# Patient Record
Sex: Male | Born: 1960 | Race: White | Hispanic: No | Marital: Married | Smoking: Never smoker
Health system: Southern US, Community
[De-identification: ages and names within clinical notes are randomized; demographics above are authoritative.]

## PROBLEM LIST (undated history)

## (undated) DIAGNOSIS — Z01811 Encounter for preprocedural respiratory examination: Secondary | ICD-10-CM

## (undated) DIAGNOSIS — M1612 Unilateral primary osteoarthritis, left hip: Secondary | ICD-10-CM

## (undated) DIAGNOSIS — R10813 Right lower quadrant abdominal tenderness: Secondary | ICD-10-CM

## (undated) DIAGNOSIS — E7800 Pure hypercholesterolemia, unspecified: Secondary | ICD-10-CM

## (undated) DIAGNOSIS — C801 Malignant (primary) neoplasm, unspecified: Secondary | ICD-10-CM

## (undated) HISTORY — PX: BACK SURGERY: SHX140

## (undated) HISTORY — PX: KNEE SURGERY: SHX244

---

## 2017-10-11 ENCOUNTER — Encounter (HOSPITAL_COMMUNITY): Payer: Self-pay | Admitting: Emergency Medicine

## 2017-10-11 ENCOUNTER — Emergency Department (HOSPITAL_COMMUNITY)
Admission: EM | Admit: 2017-10-11 | Discharge: 2017-10-11 | Disposition: A | Discharge status: Home / Self Care | Attending: Emergency Medicine | Admitting: Emergency Medicine

## 2017-10-11 ENCOUNTER — Emergency Department (HOSPITAL_COMMUNITY)

## 2017-10-11 DIAGNOSIS — R0789 Other chest pain: Secondary | ICD-10-CM

## 2017-10-11 DIAGNOSIS — Z85828 Personal history of other malignant neoplasm of skin: Secondary | ICD-10-CM | POA: Insufficient documentation

## 2017-10-11 DIAGNOSIS — Z79899 Other long term (current) drug therapy: Secondary | ICD-10-CM | POA: Insufficient documentation

## 2017-10-11 DIAGNOSIS — R079 Chest pain, unspecified: Secondary | ICD-10-CM | POA: Diagnosis not present

## 2017-10-11 HISTORY — DX: Malignant (primary) neoplasm, unspecified: C80.1

## 2017-10-11 HISTORY — DX: Pure hypercholesterolemia, unspecified: E78.00

## 2017-10-11 LAB — CBC
HCT: 38.1 % — ABNORMAL LOW (ref 39.0–52.0)
Hemoglobin: 13.4 g/dL (ref 13.0–17.0)
MCH: 32.5 pg (ref 26.0–34.0)
MCHC: 35.2 g/dL (ref 30.0–36.0)
MCV: 92.5 fL (ref 78.0–100.0)
Platelets: 186 10*3/uL (ref 150–400)
RBC: 4.12 MIL/uL — ABNORMAL LOW (ref 4.22–5.81)
RDW: 12.5 % (ref 11.5–15.5)
WBC: 6.9 10*3/uL (ref 4.0–10.5)

## 2017-10-11 LAB — BASIC METABOLIC PANEL
Anion gap: 8 (ref 5–15)
BUN: 10 mg/dL (ref 6–20)
CO2: 23 mmol/L (ref 22–32)
Calcium: 8.9 mg/dL (ref 8.9–10.3)
Chloride: 106 mmol/L (ref 101–111)
Creatinine, Ser: 0.84 mg/dL (ref 0.61–1.24)
GFR calc Af Amer: >60 mL/min (ref >60)
GFR calc non Af Amer: >60 mL/min (ref >60)
Glucose, Bld: 94 mg/dL (ref 65–99)
Potassium: 3.4 mmol/L — ABNORMAL LOW (ref 3.5–5.1)
Sodium: 137 mmol/L (ref 135–145)

## 2017-10-11 LAB — I-STAT TROPONIN, ED: Troponin i, poc: 0 ng/mL (ref 0.00–0.08)

## 2017-10-11 LAB — TROPONIN I: Troponin I: <0.03 ng/mL (ref <0.03)

## 2017-10-11 MED ORDER — POTASSIUM CHLORIDE CRYS ER 20 MEQ PO TBCR
40.0000 meq | EXTENDED_RELEASE_TABLET | Freq: Once | ORAL | Status: AC
  Administered 2017-10-11: 40 meq via ORAL
  Filled 2017-10-11: qty 2

## 2017-10-11 NOTE — ED Provider Notes (Addendum)
[facility] Provider Note   CSN: [id number] Arrival date & time: 10/11/17  1919     History   Chief Complaint Chief Complaint  Patient presents with  . Chest Pain    HPI Omar Allen is a 56 y.o. male.  HPI   56 year old male with chest pain.  Symptom onset around 7 PM while eating dinner.  Patient reports bilateral upper extremity numbness that progressed into his neck/face then began having some substernal chest pain.  Associated with dyspnea and he was diaphoretic.  Symptoms slowly resolved over approximately 10-15 minutes.  He was largely asymptomatic by the time EMS arrived.  He was given aspirin.  Reports a past history of esophageal spasm but they feel very different than what he experienced today.  She also [place] cardiac [place].  He reports "moderate blockages" and did not have intervention.  He reports no further symptoms since that time.  He does exercise regularly including walking up approximately 100 flights of steps today when she reports he tolerated well.   Past Medical History:  Diagnosis Date  . Cancer ([place])    skin  . Hypercholesterolemia     There are no active problems to display for this patient.       Home Medications    Prior to Admission medications   Medication Sig Start Date End Date Taking? Authorizing Provider  atorvastatin (LIPITOR) 40 MG tablet Take 40 mg by mouth daily.   Yes [provider]  Calcium 600-200 MG-UNIT tablet Take 1 tablet by mouth daily.   Yes [provider]  Multiple Vitamin (MULTIVITAMIN) capsule Take 1 capsule by mouth daily.   Yes [provider]    Family History No family history on file.  Social History Social History   Tobacco Use  . Smoking status: Never Smoker  . Smokeless tobacco: Never Used  Substance Use Topics  . Alcohol use: Yes    Comment: 3 beers a week  . Drug use: No      Allergies   Patient has no known allergies.   Review of Systems Review of Systems  All systems reviewed and negative, other than as noted in HPI.  Physical Exam Updated Vital Signs Temp 97.8 F (36.6 C) (Oral)   Ht 6' (1.829 m)   Wt 81.6 kg (180 lb)   SpO2 99%   BMI 24.41 kg/m   Physical Exam  Constitutional: He appears well-developed and well-nourished. No distress.  HENT:  Head: Normocephalic and atraumatic.  Eyes: Conjunctivae are normal. Right eye exhibits no discharge. Left eye exhibits no discharge.  Neck: Neck supple.  Cardiovascular: Normal rate, regular rhythm and normal heart sounds. Exam reveals no gallop and no friction rub.  No murmur heard. Pulmonary/Chest: Effort normal and breath sounds normal. No respiratory distress.  Abdominal: Soft. He exhibits no distension. There is no tenderness.  Musculoskeletal: He exhibits no edema or tenderness.  Neurological: He is alert.  Skin: Skin is warm and dry.  Psychiatric: He has a normal mood and affect. His behavior is normal. Thought content normal.  Nursing note and vitals reviewed.    ED Treatments / Results  Labs (all labs ordered are listed, but only abnormal results are displayed) Labs Reviewed  BASIC METABOLIC PANEL - Abnormal; Notable for the following components:      Result Value   Potassium 3.4 (*)    All other components within normal limits  CBC - Abnormal;  Notable for the following components:   RBC 4.12 (*)    HCT 38.1 (*)    All other components within normal limits  TROPONIN I  I-STAT TROPONIN, ED    EKG  EKG Interpretation  Date/Time:  Saturday October 11 2017 19:19:46 EST Ventricular Rate:  67 PR Interval:    QRS Duration: 89 QT Interval:  363 QTC Calculation: 384 R Axis:   86 Text Interpretation:  Sinus rhythm Borderline T abnormalities, inferior leads No old tracing to compare Confirmed by Virgel Manifold [id number]) on 10/11/2017 8:03:02 PM       Radiology Dg Chest 2  View  Result Date: 10/11/2017 CLINICAL DATA:  Left-sided chest pain.  Upper extremity numbness. EXAM: CHEST  2 VIEW COMPARISON:  None. FINDINGS: The heart size and mediastinal contours are within normal limits. Both lungs are clear. The visualized skeletal structures are unremarkable. IMPRESSION: No active cardiopulmonary disease. Electronically Signed   By: Dorise Bullion III M.D   On: 10/11/2017 20:11    Procedures Procedures (including critical care time)  Medications Ordered in ED Medications - No data to display   Initial Impression / Assessment and Plan / ED Course  I have reviewed the triage vital signs and the nursing notes.  Pertinent labs & imaging results that were available during my care of the patient were reviewed by me and considered in my medical decision making (see chart for details).     56 year old male with chest pain.  Some typical features which are concerning.  His EKG is not normal but I do not have any for comparison purposes.  He apparently has CAD to some degree although no previous interventions.     I do not have a great alternative etiology for his symptoms otherwise.  I doubt PE, dissection, infectious, etc. I recommended admission.  He is very hesitant to do so.  He is visiting from [place].  His daughter is competing in swimming at the aquatic center here.  He would prefer to follow back up in [place]. I expressed my concerns to him and his wife about him having a significant cardiac event in the interim.   At the very least, he needs a repeat troponin with his symptoms starting around 1900. He is agreeable to this. I also called cardiology as well to see is they could speak with him. He also recommended admission for rule out, but doesn't think he'll be available in the next couple hours for a non-emergent consult.   Final Clinical Impressions(s) / ED Diagnoses   Final diagnoses:  Chest pain, unspecified type    ED Discharge Orders    None        Virgel Manifold, MD 10/14/17 1128    Virgel Manifold, MD 11/04/17 1550

## 2017-10-11 NOTE — ED Triage Notes (Signed)
Brought by ems for c/o left chest pressure that started while eating dinner.  Had the same thing but less intense 2 years ago that was diagnosed as indigestion.  Also reports having Ccath 2 years ago that showed moderate blockages.  No stents were placed.  Pain free in triage.  Also reports that episode started with numbness in both arms before pain started in chest.

## 2017-10-11 NOTE — ED Notes (Signed)
Taken to xray at this time. 

## 2017-10-11 NOTE — Discharge Instructions (Signed)
Follow-up as soon as you can when you get back home.

## 2017-10-11 NOTE — Consult Note (Addendum)
CARDIOLOGY ED CONSULTATION NOTE  Patient ID: Omar Allen MRN: [id number], DOB/AGE: 54-19-1962   Admit date: 10/11/2017   Primary Physician: System, Pcp Not In Primary Cardiologist: at [place] [facility], [facility]  Reason for Consult:   Chest pain  Requesting Physician: Dr. Wilson Singer (ED)  HPI: This is a 56 y.o. male with moderate CAD and esophageal spasm who presented with an episode of chest pain.  Patient is visiting in [place] when during supper around 7 pm tonight he started having neck pain, radiating to the both arms with numbness and weakness. He then started having elevated BP in 160s with severe chest pressure. It lasted for 10 minutes and he couldn't even stand up. This was associated with diaphoresis. Patient does not remember if these chest pains are similar to his prior episodes 2 years ago. 2 years ago he was having esophageal spasms that usually last for 1-2 minutes and keep going for 2 hours. They improved on their own. He works out every day and takes about 100 steps up. He did not feel any pain during that time. He is adamant about leaving the hospital and not waiting for troponin draws after 10 pm tonight or any stress test.  His initial EKG and trop were negative.    Problem List: Past Medical History:  Diagnosis Date  . Cancer ([place])    skin  . Hypercholesterolemia     Past Surgical History:  Procedure Laterality Date  . BACK SURGERY    . KNEE SURGERY Right      Allergies: No Known Allergies   Home Medications No current facility-administered medications for this encounter.    Current Outpatient Medications  Medication Sig Dispense Refill  . atorvastatin (LIPITOR) 40 MG tablet Take 40 mg by mouth daily.    . Calcium 600-200 MG-UNIT tablet Take 1 tablet by mouth daily.    . Multiple Vitamin (MULTIVITAMIN) capsule Take 1 capsule by mouth daily.       Family History  Problem Relation Age of Onset  . Coronary artery disease Neg Hx      Social  History   Socioeconomic History  . Marital status: Married    Spouse name: Not on file  . Number of children: Not on file  . Years of education: Not on file  . Highest education level: Not on file  Social Needs  . Financial resource strain: Not on file  . Food insecurity - worry: Not on file  . Food insecurity - inability: Not on file  . Transportation needs - medical: Not on file  . Transportation needs - non-medical: Not on file  Occupational History  . Not on file  Tobacco Use  . Smoking status: Never Smoker  . Smokeless tobacco: Never Used  Substance and Sexual Activity  . Alcohol use: Yes    Comment: 3 beers a week  . Drug use: No  . Sexual activity: Not on file  Other Topics Concern  . Not on file  Social History Narrative  . Not on file     Review of Systems: General: fatigue increase weight negative for chills, fever, night sweats  Cardiovascular: leg edema, dyspnea but no chest pain  Dermatological: negative for rash Respiratory: productive cough negative for wheezing  Urologic: negative for hematuria Abdominal: negative for nausea, vomiting, diarrhea, bright red blood per rectum, melena, or hematemesis Neurologic: negative for visual changes, syncope, or dizziness Hematology: no anemia Psychiatry: non suicidal/homicidal  Musculoskeletal: back pain   Physical Exam: Vitals:  BP 116/71   Pulse 70   Temp 97.8 F (36.6 C) (Oral)   Resp 19   Ht 6' (1.829 m)   Wt 81.6 kg (180 lb)   SpO2 98%   BMI 24.41 kg/m  General: not in acute distress Neck: JVP flat, neck supple Heart: regular rate and rhythm, S1, S2, no murmurs lungs: CTAB  GI: non tender, non distended, bowel sounds present Extremities: no edema Neuro: AAO x 3  Psych: normal affect, no anxiety   Labs:   Results for orders placed or performed during the hospital encounter of 10/11/17 (from the past 24 hour(s))  Basic metabolic panel     Status: Abnormal   Collection Time: 10/11/17  7:28 PM    Result Value Ref Range   Sodium 137 135 - 145 mmol/L   Potassium 3.4 (L) 3.5 - 5.1 mmol/L   Chloride 106 101 - 111 mmol/L   CO2 23 22 - 32 mmol/L   Glucose, Bld 94 65 - 99 mg/dL   BUN 10 6 - 20 mg/dL   Creatinine, Ser 0.84 0.61 - 1.24 mg/dL   Calcium 8.9 8.9 - 10.3 mg/dL   GFR calc non Af Amer >60 >60 mL/min   GFR calc Af Amer >60 >60 mL/min   Anion gap 8 5 - 15  CBC     Status: Abnormal   Collection Time: 10/11/17  7:28 PM  Result Value Ref Range   WBC 6.9 4.0 - 10.5 K/uL   RBC 4.12 (L) 4.22 - 5.81 MIL/uL   Hemoglobin 13.4 13.0 - 17.0 g/dL   HCT 38.1 (L) 39.0 - 52.0 %   MCV 92.5 78.0 - 100.0 fL   MCH 32.5 26.0 - 34.0 pg   MCHC 35.2 30.0 - 36.0 g/dL   RDW 12.5 11.5 - 15.5 %   Platelets 186 150 - 400 K/uL  I-stat troponin, ED     Status: None   Collection Time: 10/11/17  8:14 PM  Result Value Ref Range   Troponin i, poc 0.00 0.00 - 0.08 ng/mL   Comment 3             Radiology/Studies: Dg Chest 2 View  Result Date: 10/11/2017 CLINICAL DATA:  Left-sided chest pain.  Upper extremity numbness. EXAM: CHEST  2 VIEW COMPARISON:  None. FINDINGS: The heart size and mediastinal contours are within normal limits. Both lungs are clear. The visualized skeletal structures are unremarkable. IMPRESSION: No active cardiopulmonary disease. Electronically Signed   By: Dorise Bullion III M.D   On: 10/11/2017 20:11    EKG: normal sinus rhythm, no st t wave changes  Echo: not available  Cardiac cath: per patient, had moderate CAD but no further follow up was performed  Medical decision making:  Discussed care with the patient and wife Discussed care with the ED physician  Reviewed labs and imaging personally Reviewed prior records  ASSESSMENT AND PLAN:  This is a 56 y.o. male with known moderate CAD and esophageal spasm presented with isolated episode of atypical chest pain    Active Problems:   Atypical chest pain  Atypical cardiac chest pain, possible ACS Admit for  observation Cycle troponin Serial EKGs Unclear if prior stress test was positive for multivessel disease. Consider cardiac stress test to risk stratify in AM NPO post midnight  Lipid panel, HbA1c, TSH Aspirin and statin  Echocardiogram in AM Patient refused to be admitted.      Signed, Flossie Dibble, MD MS 10/11/2017, 10:10 PM

## 2017-10-11 NOTE — ED Notes (Signed)
Pt ambulated to the bathroom w/o difficulty 

## 2019-04-10 IMAGING — DX DG CHEST 2V
2 series · 2 of 2 positions shown · non-contrast
Comparison: None.

CLINICAL DATA: Left-sided chest pain.  Upper extremity numbness.

EXAM:
CHEST  2 VIEW

[chest pa]
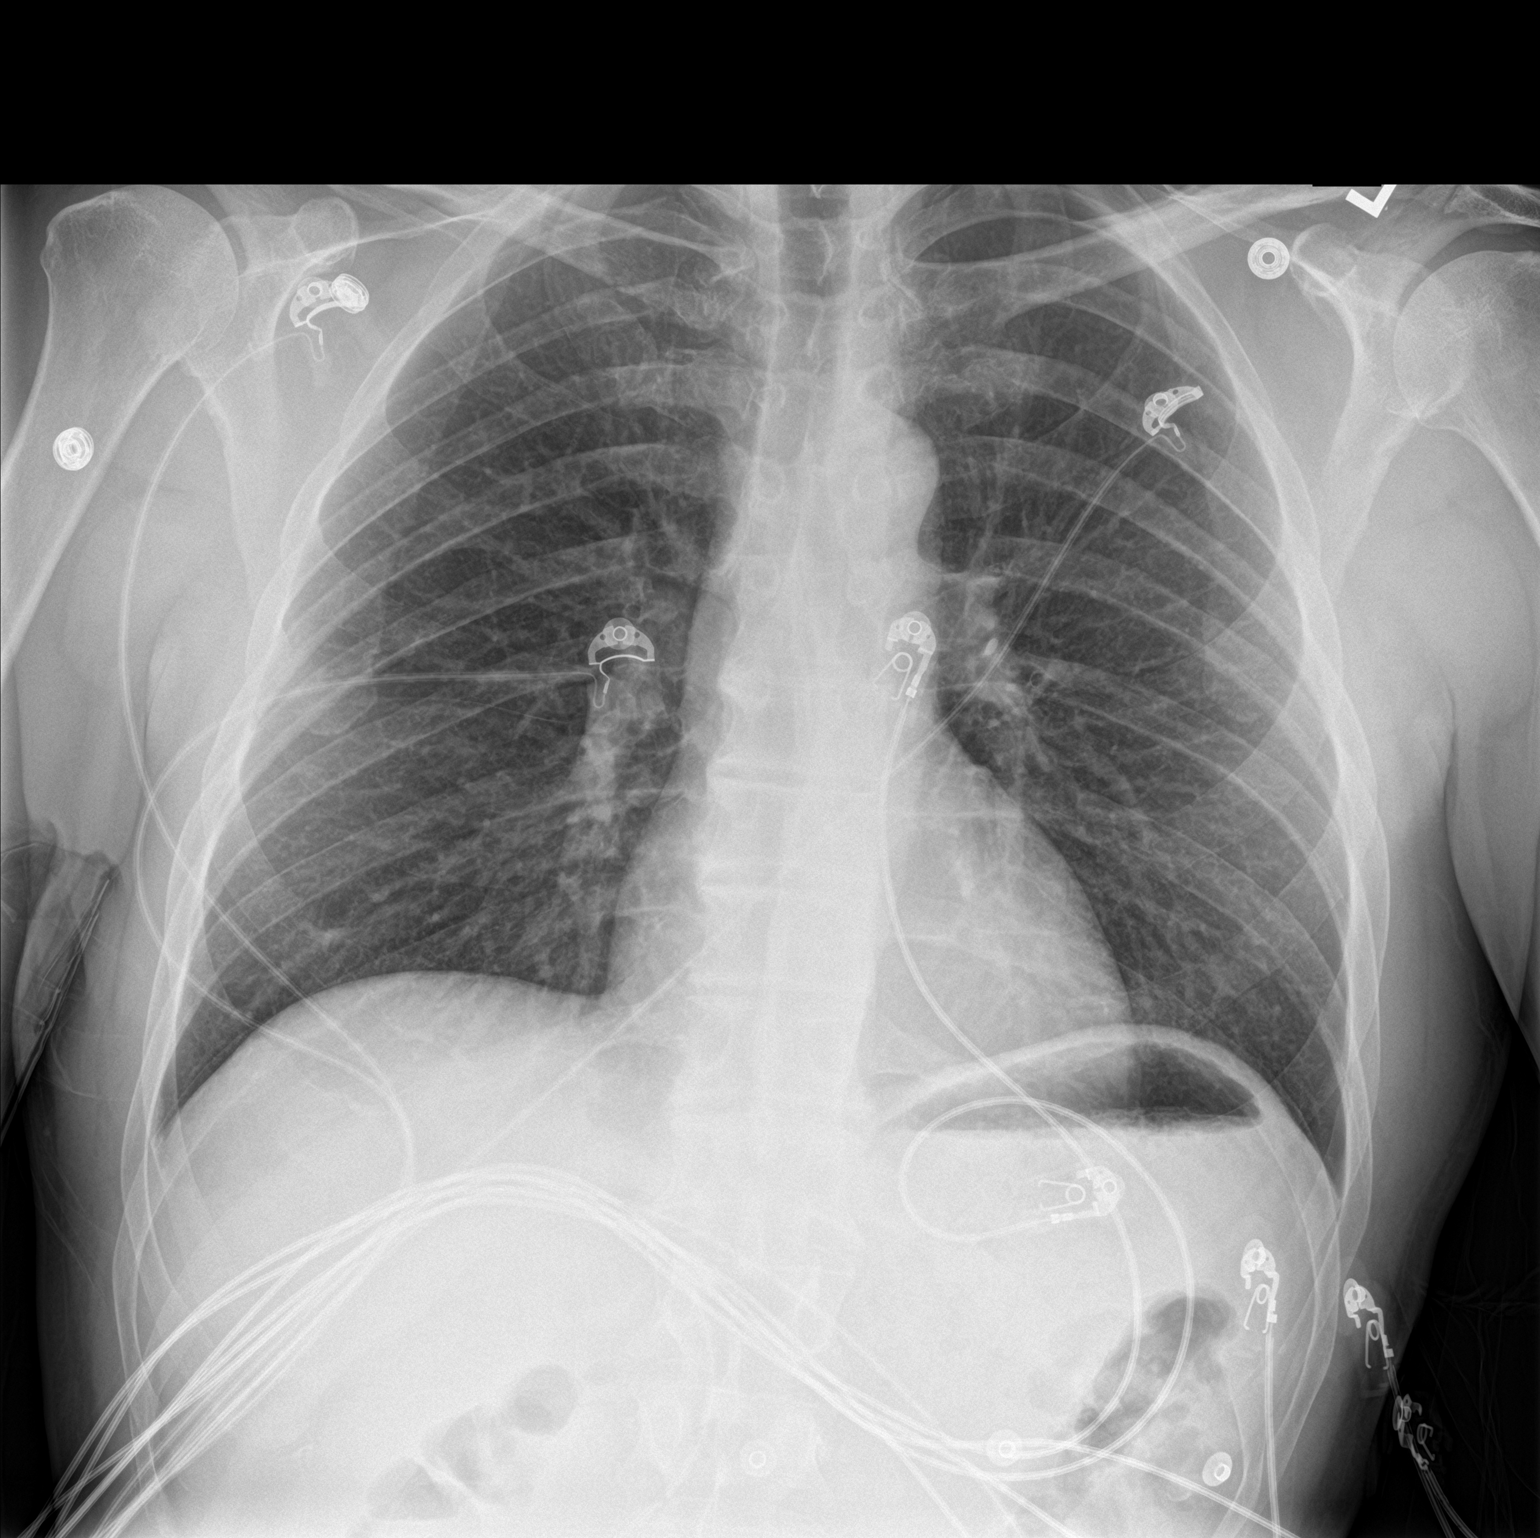

[chest lat]
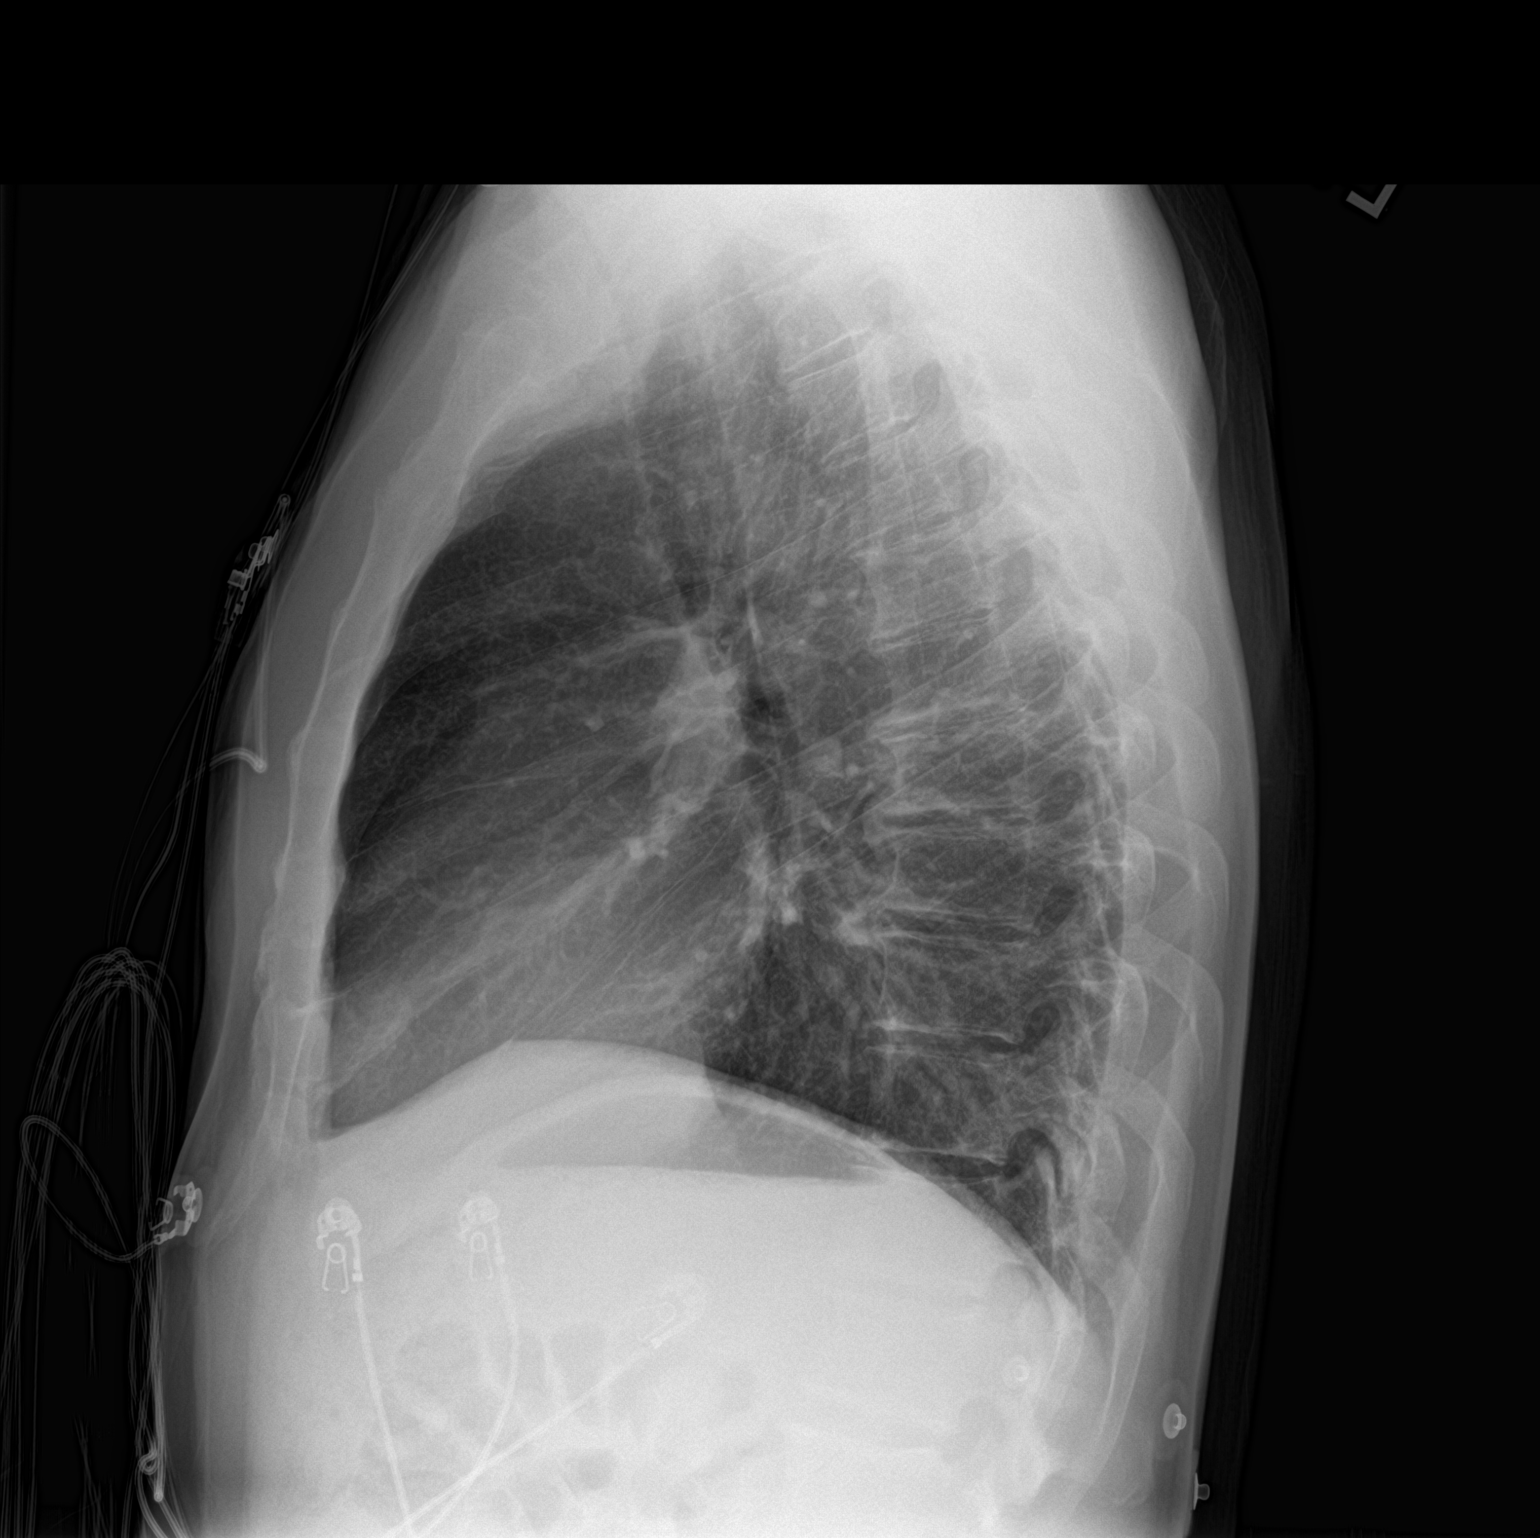

[2 of 2 positions shown; findings below may reference images not displayed]

FINDINGS: The heart size and mediastinal contours are within normal limits.
Both lungs are clear. The visualized skeletal structures are
unremarkable.
IMPRESSION: No active cardiopulmonary disease.

## 2022-02-18 ENCOUNTER — Encounter

## 2022-02-18 ENCOUNTER — Inpatient Hospital Stay: Admit: 2022-02-18 | Payer: PRIVATE HEALTH INSURANCE | Primary: Diagnostic Radiology

## 2022-02-18 ENCOUNTER — Inpatient Hospital Stay: Admit: 2022-02-18 | Payer: PRIVATE HEALTH INSURANCE | Primary: Internal Medicine

## 2022-02-18 DIAGNOSIS — Z01811 Encounter for preprocedural respiratory examination: Secondary | ICD-10-CM

## 2022-02-18 DIAGNOSIS — Z01818 Encounter for other preprocedural examination: Secondary | ICD-10-CM

## 2022-02-18 LAB — EKG 12-LEAD
Atrial Rate: 56 {beats}/min
Calculated P Axis: -10 degrees
Calculated R Axis: 63 degrees
Calculated T Axis: 17 degrees
P-R Interval: 150 ms
Q-T Interval: 420 ms
QRS Duration: 90 ms
QTC Calculation (Bezet): 405 ms
Ventricular Rate: 56 {beats}/min

## 2022-02-18 LAB — CBC WITH AUTO DIFFERENTIAL
Basophils: 0.8 % (ref 0–3)
Eosinophils: 2.5 % (ref 0–5)
Hematocrit: 42.1 % (ref 37.0–50.0)
Hemoglobin: 14.3 gm/dl (ref 12.4–17.2)
Immature Granulocytes: 0.2 % (ref 0.0–3.0)
Lymphocytes: 32.5 % (ref 28–48)
MCH: 32.5 pg (ref 23.0–34.6)
MCHC: 34 gm/dl (ref 30.0–36.0)
MCV: 95.7 fL (ref 80.0–98.0)
MPV: 11.3 fL — ABNORMAL HIGH (ref 6.0–10.0)
Monocytes: 10.1 % (ref 1–13)
Neutrophils Segmented: 53.9 % (ref 34–64)
Nucleated RBCs: 0 (ref 0–0)
Platelets: 195 10*3/uL (ref 140–450)
RBC: 4.4 M/uL (ref 3.80–5.70)
RDW: 43.7 (ref 35.1–43.9)
WBC: 4.9 10*3/uL (ref 4.0–11.0)

## 2022-02-18 LAB — URINALYSIS
Bilirubin, Urine: NEGATIVE
Blood, Urine: NEGATIVE
Glucose, Ur: NEGATIVE mg/dl
Ketones, Urine: NEGATIVE mg/dl
Leukocyte Esterase, Urine: NEGATIVE
Nitrite, Urine: NEGATIVE
Protein, Urine: NEGATIVE mg/dl
Specific Gravity, Urine: 1.01 (ref 1.005–1.030)
Urobilinogen, Urine: 0.2 mg/dl (ref 0.0–1.0)
pH, Urine: 6 (ref 5.0–9.0)

## 2022-02-18 LAB — COMPREHENSIVE METABOLIC PANEL
ALT: 22 U/L (ref 10–49)
AST: 30 U/L (ref 0.0–33.9)
Albumin: 3.9 gm/dl (ref 3.4–5.0)
Alkaline Phosphatase: 44 U/L — ABNORMAL LOW (ref 46–116)
Anion Gap: 9 mmol/L (ref 5–15)
BUN: 11 mg/dl (ref 9–23)
CO2: 26 mEq/L (ref 20–31)
Calcium: 9 mg/dl (ref 8.7–10.4)
Chloride: 108 mEq/L — ABNORMAL HIGH (ref 98–107)
Creatinine: 0.76 mg/dl (ref 0.70–1.30)
GFR African American: >60
GFR Non-African American: >60
Glucose: 97 mg/dl (ref 74–106)
Potassium: 4.1 mEq/L (ref 3.5–5.1)
Sodium: 143 mEq/L (ref 136–145)
Total Bilirubin: 0.9 mg/dl (ref 0.30–1.20)
Total Protein: 6.6 gm/dl (ref 5.7–8.2)

## 2022-02-18 LAB — MRSA BY PCR: MRSA PCR screen: NEGATIVE

## 2022-02-18 LAB — PROTIME-INR
INR: 0.9 (ref 0.1–1.1)
Protime: 10.4 seconds (ref 10.2–12.9)

## 2022-02-18 LAB — APTT: aPTT: 30.8 seconds (ref 25.1–36.5)

## 2022-02-18 LAB — HEMOGLOBIN A1C: Hemoglobin A1C: 5.5 % (ref 3.8–5.6)

## 2022-02-19 LAB — CULTURE, URINE: Culture Result: NO GROWTH

## 2022-03-04 NOTE — Progress Notes (Signed)
Patient attended Pre-Op Joint Replacement Class on 12/24/2021.  Information provided regarding general expectations verbally and in writing (hip booklet) including (but not limited to):   Discharge planning, physical therapy and exercises to start prior to surgery, occupational therapy, medical equipment, pain management and preventative measures (safety, infection and blood clots).

## 2022-03-06 NOTE — Anesthesia Pre-Procedure Evaluation (Incomplete)
Department of Anesthesiology  Preprocedure Note       Name:  Jose Esparza   Age:  61 y.o.  DOB:  12/08/1960                                          MRN:  [id number]         Date:  03/06/2022      Surgeon: Moishe Spice):  Caryl Ada, MD    Procedure: Procedure(s):  LEFT HIP ARTHROPLASTY       PCP:     Joint Class: Patient attended Pre-Op Joint Replacement Class on 12/24/2021    Medications prior to admission:   Prior to Admission medications    Not on File       Current medications:    No current outpatient medications on file.     No current facility-administered medications for this encounter.       Allergies:  Not on File    Problem List:  There is no problem list on file for this patient.      Past Medical History:  No past medical history on file.    Past Surgical History:  No past surgical history on file.    Social History:    Social History     Tobacco Use   . Smoking status: Not on file   . Smokeless tobacco: Not on file   Substance Use Topics   . Alcohol use: Not on file                                Counseling given: Not Answered      Vital Signs (Current): There were no vitals filed for this visit.                                           BP Readings from Last 3 Encounters:   No data found for BP       NPO Status:                                                                                 BMI:   Wt Readings from Last 3 Encounters:   No data found for Wt     There is no height or weight on file to calculate BMI.    CBC:   Lab Results   Component Value Date/Time    WBC 4.9 02/18/2022 09:23 AM    RBC 4.40 02/18/2022 09:23 AM    HGB 14.3 02/18/2022 09:23 AM    HCT 42.1 02/18/2022 09:23 AM    MCV 95.7 02/18/2022 09:23 AM    RDW 43.7 02/18/2022 09:23 AM    PLT 195 02/18/2022 09:23 AM       CMP:   Lab Results   Component Value Date/Time    NA 143 02/18/2022 09:23 AM    K 4.1 02/18/2022 09:23 AM    CL  108 02/18/2022 09:23 AM    CO2 26 02/18/2022 09:23 AM    BUN 11 02/18/2022 09:23 AM    CREATININE 0.76  02/18/2022 09:23 AM    GFRAA >60 02/18/2022 09:23 AM    LABGLOM >60 02/18/2022 09:23 AM    GLUCOSE 97 02/18/2022 09:23 AM    CALCIUM 9.0 02/18/2022 09:23 AM    BILITOT 0.90 02/18/2022 09:23 AM    ALKPHOS 44 02/18/2022 09:23 AM    AST 30.0 02/18/2022 09:23 AM    ALT 22 02/18/2022 09:23 AM       Coags:   Lab Results   Component Value Date/Time    PROTIME 10.4 02/18/2022 09:23 AM    INR 0.9 02/18/2022 09:23 AM    APTT 30.8 02/18/2022 09:23 AM       Type & Screen :    MRSA 02/18/2022:   MRSA PCR screen NEGATIVE  NEGATIVE     Urine culture 02/18/2022:   Culture Result  No Growth     HBA1C 02/18/2022  Hemoglobin A1C 3.8 - 5.6 % 5.5        COVID-19 Screening :      Chest x-ray 02/18/2022:  IMPRESSION:  No acute pulmonary process      EKG 02/18/2022:  Sinus bradycardia - V rate 56  Septal infarct , age undetermined   Abnormal ECG   No previous ECGs available     Anesthesia Evaluation  Patient summary reviewed and Nursing notes reviewed  Airway:           Dental:          Pulmonary:                              Cardiovascular:  Exercise tolerance: good (>4 METS),                     Neuro/Psych:               GI/Hepatic/Renal:             Endo/Other:    (+) : arthritis:., .                 Abdominal:             Vascular:          Other Findings:           Anesthesia Plan        Blair Promise, PA   03/06/2022

## 2022-03-11 ENCOUNTER — Inpatient Hospital Stay: Payer: PRIVATE HEALTH INSURANCE | Primary: Internal Medicine

## 2022-03-13 ENCOUNTER — Inpatient Hospital Stay: Admit: 2022-03-13 | Payer: PRIVATE HEALTH INSURANCE | Primary: Internal Medicine

## 2022-03-13 DIAGNOSIS — Z01812 Encounter for preprocedural laboratory examination: Secondary | ICD-10-CM

## 2022-03-13 LAB — ANTIBODY SCREEN: Antibody Screen: NEGATIVE

## 2022-03-13 LAB — ABO/RH: ABO/Rh: A POS

## 2022-03-13 NOTE — Other (Signed)
PREOPERATIVE INSTRUCTIONS  Please Read Carefully    [x]  Your procedure is scheduled on: 03/18/2022    [x]  The day before your surgery, call the surgeon's office to check on the surgery time and the time you should arrive.     [x]  On the day of your surgery, arrive at the time given to you by your surgeon and check in at the first floor registration desk.    [x]  DO NOT eat anything after midnight before your surgery. This includes gum, mints or hard candy.  May have Water, black coffee (NO CREAM) or regular Gatorade (not sugar free) up to 3 hours prior to surgery, but no more than 6 ounces per hour (approx.  cup).    [x]  You may brush your teeth the morning of surgery, however DO NOT swallow any water.    []  No smoking after midnight.  Smoking should be reduced a few days prior to surgery.    []  If you are having an outpatient procedure, you must have a responsible adult bring you to the hospital.  They must remain in the surgical waiting area for the duration of your procedure and provide you with transportation home.  You may not drive yourself home.  We also recommend that you arrange for a responsible person to stay with you for 24 hours following your procedure.  Failure to comply with these instructions may result in cancellation of the procedure.    []  A parent or legal guardian must accompany minors to the hospital.  LEGAL GUARDIANS MUST bring custody papers with them the day of surgery.    [x]  If the lab gives you a blue blood ID band, do not throw it away.  Bring it with you on the day of surgery.      []  Please return to preop surgical testing no more than 14 days before surgery date to have your type and screen done prior to surgery.    []  If you have been diagnosed with Sleep Apnea and are using a CPAP, BiPAP, and/or Bi-Flex machine, please bring it with you the day of surgery.    []  Endoscopy patients, please follow the instructions provided by the doctors office.    []  If crutches are  ordered, you must get them and be instructed on proper use before the day of surgery.  Please bring them with you the day of surgery.      PREPARING THE SKIN BEFORE SURGERY Can Reduce the Risk of Infection  [x]  You have been given a Chlorhexidine skin preparation packet with instructions to bathe the night before surgery and the morning of surgery prior to arrival.    [x]  If allergic to Chlorhexidine, use Dial (antibacterial) soap to bathe your skin the night before surgery and the morning of surgery.    [x]  Do not shave your face, underarms, legs or any part of your body at least 48 hours before surgery.  Any clipping needed for surgery will be done at the hospital.      Iva  [x]  Wear casual, loose fitting comfortable clothes the day of surgery.    [x]  Remove ALL jewelry, including body piercings.  Leave these and all valuables at home.  Leave suitcases and/or overnight bag at home or in the car for a family member to bring when you get a room.    [x]  DO NOT wear any makeup, nail polish, deodorant, body lotion, aftershave or contact lenses the day of  surgery.  Please bring your glasses and glasses case with you the day of surgery.    [x]  Bring any additional paperwork, such as doctors orders, consent form, POA (power of attorney), and/or advanced directives with you the day of surgery.    [x]  Please notify your surgeon of any changes in your condition such as fever, sore throat or rash as soon as possible before your surgery date.  After office hours, call your surgeon's answering service.    [x] Bring picture identification and insurance cards with you the day of surgery.      MEDICATIONS:  [x]  Stop taking aspirin and aspirin products/NSAIDs (non-steroidal anti-inflammatory drugs such as Motrin, Aleve, Advil, ibuprofen and BC Powder), vitamins and herbal pills 2 weeks before surgery OR as instructed by your doctor.  However, if you take a daily aspirin, please contact your primary care  provider (PCP), for instructions prior to surgery.    []  Contact your doctor regarding any blood thinner medications you take (such as Coumadin, Plavix, etc.) for instructions about what to do prior to surgery.    [] If you have diabetes, hold oral diabetic medications the night before surgery and the morning of surgery.  Hold insulin the morning of surgery unless told otherwise by your doctor.    []  If you have asthma or use inhalers please bring them the day of surgery.    [x] Take the following medications (oral medications with a sip of water)  the day of surgery:     NONE  If needed:        Prior to Visit Medications    Medication Sig Taking? Authorizing Provider   atorvastatin (LIPITOR) 40 MG tablet Take 1 tablet by mouth daily  Historical Provider, MD   Coenzyme Q10 (CO Q 10 PO) Take by mouth daily  Historical Provider, MD   zinc gluconate 50 MG tablet Take 1 tablet by mouth daily  Historical Provider, MD   Multiple Vitamins-Minerals (MULTIVITAMIN ADULTS PO) Take by mouth daily  Historical Provider, MD   APPLE CIDER VINEGAR PO Take by mouth daily  Historical Provider, MD        *Visitor Policy-One visitor per patient, must be 40 years of age or older, must wear a mask. Upon check in, the patient must arrive with their post procedure transportation home. The post-procedure transporting party must remain within the surgical waiting room for the duration of the procedure. Failure may result in the cancellation of the procedure*      We want you to have a positive experience at [facility].  If any of these these instructions are not met, it is possible that your surgery will be canceled.  If you have any questions regarding your surgery, please call [facility] at [phone] or your surgeon's office for further assistance.           [facility]    [facility] Anesthesia     There are many ways to perform anesthesia for surgery.  The 2 techniques are generally classified as General Anesthesia  and Regional Anesthesia.  While both techniques are very safe, they are distinctly different.  As with any anesthesia, there are risks, which may be increased if you already have heart disease, chronic lung conditions, or other serious medical problems.    General anesthesia puts you to sleep for your surgery.  It acts on your brain and nerves, and affects your entire body.  It may be administered by an injection, or through inhaling medication.  After you are asleep, a breathing tube may be placed in your windpipe to help you breathe during surgery.  General anesthesia may be more appropriate for longer or more involved surgery, especially if the position you will be in surgery is uncomfortable.  With general anesthesia you may experience a sore throat and hoarse voice for a few days, headache, nausea/vomiting, drowsiness, or blood pressure and breathing problems.    Regional anesthesia involves blocking the nerves to a specific area of the body with local anesthesia medication ("numbing medicine").  It is usually given in conjunction with varying degrees of twilight sedation.  When at all possible, the recommended type of anesthesia for both total knee replacement (TKA) and total hip replacement (THA) is a regional anesthesia technique.  This can be achieved utilizing a spinal block technique, or third placement of an epidural catheter.  In addition, your anesthesiologist may recommend that you have a peripheral nerve block placed to help with pain after the procedure.    -Spinal block-In a spinal block, local anesthetic (i.e. numbing medicine) is injected into the fluid that baths the spinal cord in the lower part of your back.  This produces a rapid numbing effect that wears off in a few hours.  After meeting the anesthesiologist and discussing her medical conditions and history, the anesthesiologist may decide to place a long-acting pain medicine as well.  There are some medical conditions and home medications  that may preclude a spinal block.    -Epidural block-An epidural block uses a catheter inserted into your lower back to deliver numbing medicine.  The epidural block in the spinal block are administered in a very similar location and technique; however, the epidural catheter is placed in a slightly different area around the spine as compared to a spinal block.    -Peripheral nerve block (PNB)-For TKA, the anesthesiologist may discuss performing a PNB.  This technique places local anesthetic directly around the major nerves in your thigh, and one or multiple locations.  These blocks numb only the leg that is injected, and do not affect the other leg.  PNB's are used in addition to another anesthesia technique, and are for postoperative pain relief only.    The advantages of regional anesthesia for joint replacement surgery are considered to be significant.  There is a significant reduction in blood loss and blood transfusions, less nausea/vomiting, less drowsiness, improved pain control after surgery, better diabetes control, and less association with infection.  Additionally, there also are reduced risks (as compared to general anesthesia techniques) with serious medical complications such as heart attack, stroke, pneumonia, respiratory depression, or development of a blood clot in your legs that can go to your lungs.  Like with any technique, there are risks.  With regional anesthesia, there is a low incidence of headache and nerve damage.  Most commonly though, if the regional technique proves challenging, it is a difficulty in getting the numbing medicine in close proximity to the nerves perform the nerve block.  Extremely rarely (1 in 200,000), there can be a collection of blood from around the nerves.  Some patients may experience difficulty voiding (passing urine) for a period of time after a regional technique.

## 2022-03-13 NOTE — Progress Notes (Signed)
Surgical Posting Criteria for Total Joints: Hips & Knees from surgeon's office on 03/13/2022:

## 2022-03-15 NOTE — Discharge Instructions (Signed)
[address]  [place], [place] [address]  Office: [phone]  Dr. Caryl Ada, MD  Brandy Hale, PA-C    Discharge Instructions After Your Total Hip Replacement Surgery  Full weight bearing as tolerated with walker.  You will likely transition to a cane by your first follow-up appointment.  You had an anterior approach total hip replacement and do not have formal hip precautions.    Medication for Pain:  Roxicodone (Oxycodone): 1 pill every 4 hours as needed for pain  Mobic: Take as directed.  Tylenol 500 mg: 2 pills every 6 hours around the clock.              **Please call the office at least 3 days before your medication runs out and allow 24 hours for a response.                                                                                                  DVT prevention:    Aspirin 325 mg twice daily for 6 weeks.  TED hose on both legs for 2 weeks from surgery.  Walk safely as much as you are comfortable!     Showering:  Remove Aquacel bandage and begin to shower 4 days after surgery as long as the incision is dry.   Do not soak in a bath tub, hot tub, or pool until the doctor tells you it is O.K. to do so.   Once you are done showering pat the wound dry and leave open to air.    Dressing:  Remove dressing 4 days after your surgery.    Incision:  Please do not apply any lotions, ointments or creams to the surgical site.  If your incision starts to drain contact the office ASAP.    Ice:  You should ice the hip/thigh as often as possible (especially after exercising) to reduce swelling and discomfort.   Do not ice the hip more than 20 minutes at a time.    Follow-up Visit:  You should have a follow-up visit already scheduled at approximally 2-3 weeks after surgery.      Common Concerns  1. Numbness around the incision site id not unusual.  2. A sudden rush or feeling of fullness with pain when going from a sitting to a standing position is common after surgery.  3. Bruising and/or swelling of the  thigh or shin and ankle is common after surgery.  4. Contact the office ASAP for:  *Increased drainage from the incision site.  *Worsening pain and redness around the incision.  *Swelling or redness in the calf (behind the leg between the knee and the ankle)    Points of contact:  Ms Lucky Rathke at [facility] [phone], email [email or web address].  Mrs. Nechama Guard at [facility] [phone]  Mrs Leighton Ruff [place] office  [phone]    **If you need immediate medical attention please go to the nearest Emergency Department.      Rhunette Croft, PA-C  Mar 15, 2022  7:51 AM

## 2022-03-18 ENCOUNTER — Inpatient Hospital Stay: Payer: 59 | Attending: Adult Reconstructive Orthopaedic Surgery

## 2022-03-18 ENCOUNTER — Observation Stay: Admit: 2022-03-18 | Payer: PRIVATE HEALTH INSURANCE | Primary: Internal Medicine

## 2022-03-18 LAB — POCT GLUCOSE: POC Glucose: 142 mg/dL — ABNORMAL HIGH (ref 65–105)

## 2022-03-18 MED ORDER — HYDROMORPHONE HCL 1 MG/ML IJ SOLN
1 MG/ML | INTRAMUSCULAR | Status: AC | PRN
Start: 2022-03-18 — End: 2022-03-19

## 2022-03-18 MED ORDER — TRANEXAMIC ACID 1000 MG/10ML IV SOLN
100010 MG/10ML | INTRAVENOUS | Status: AC
Start: 2022-03-18 — End: 2022-03-18
  Administered 2022-03-18 (×2): 1000 mg via INTRAVENOUS

## 2022-03-18 MED ORDER — ROCURONIUM BROMIDE 50 MG/5ML IV SOLN
50 MG/5ML | INTRAVENOUS | Status: DC | PRN
Start: 2022-03-18 — End: 2022-03-18
  Administered 2022-03-18: 13:00:00 10 via INTRAVENOUS
  Administered 2022-03-18: 12:00:00 50 via INTRAVENOUS
  Administered 2022-03-18: 12:00:00 30 via INTRAVENOUS

## 2022-03-18 MED ORDER — NOREPINEPHRINE BITARTRATE 1 MG/ML IV SOLN
1 MG/ML | INTRAVENOUS | Status: AC
Start: 2022-03-18 — End: ?

## 2022-03-18 MED ORDER — NALOXONE HCL 0.4 MG/ML IJ SOLN
0.4 MG/ML | INTRAMUSCULAR | Status: AC
Start: 2022-03-18 — End: 2022-03-19

## 2022-03-18 MED ORDER — ATORVASTATIN CALCIUM 40 MG PO TABS
40 MG | Freq: Every evening | ORAL | Status: AC
Start: 2022-03-18 — End: 2022-03-19
  Administered 2022-03-19: 02:00:00 40 mg via ORAL

## 2022-03-18 MED ORDER — FENTANYL CITRATE (PF) 100 MCG/2ML IJ SOLN
100 MCG/2ML | INTRAMUSCULAR | Status: DC | PRN
Start: 2022-03-18 — End: 2022-03-18
  Administered 2022-03-18 (×4): 25 ug via INTRAVENOUS

## 2022-03-18 MED ORDER — SODIUM CHLORIDE 0.9 % IV SOLN (MINI-BAG)
0.9 % | Freq: Three times a day (TID) | INTRAVENOUS | Status: AC
Start: 2022-03-18 — End: 2022-03-18
  Administered 2022-03-18 – 2022-03-19 (×2): 2000 mg via INTRAVENOUS

## 2022-03-18 MED ORDER — NALOXONE HCL 2 MG/2ML IJ SOSY
2 MG/ML | INTRAMUSCULAR | Status: DC
Start: 2022-03-18 — End: 2022-03-18

## 2022-03-18 MED ORDER — FAMOTIDINE (PF) 20 MG/2ML IV SOLN
20 MG/2ML | Freq: Two times a day (BID) | INTRAVENOUS | Status: AC
Start: 2022-03-18 — End: 2022-03-19

## 2022-03-18 MED ORDER — ONDANSETRON HCL 4 MG/2ML IJ SOLN
4 MG/2ML | Freq: Once | INTRAMUSCULAR | Status: DC | PRN
Start: 2022-03-18 — End: 2022-03-18

## 2022-03-18 MED ORDER — BUPIVACAINE LIPOSOME 1.3 % IJ SUSP
1.3 % | INTRAMUSCULAR | Status: AC
Start: 2022-03-18 — End: ?

## 2022-03-18 MED ORDER — LIDOCAINE HCL (PF) 2 % IJ SOLN
2 % | INTRAMUSCULAR | Status: AC
Start: 2022-03-18 — End: ?

## 2022-03-18 MED ORDER — ONDANSETRON 4 MG PO TBDP
4 MG | Freq: Three times a day (TID) | ORAL | Status: AC | PRN
Start: 2022-03-18 — End: 2022-03-19

## 2022-03-18 MED ORDER — SUGAMMADEX SODIUM 200 MG/2ML IV SOLN
200 MG/2ML | INTRAVENOUS | Status: AC
Start: 2022-03-18 — End: ?

## 2022-03-18 MED ORDER — HYDROMORPHONE HCL 1 MG/ML IJ SOLN
1 MG/ML | INTRAMUSCULAR | Status: DC | PRN
Start: 2022-03-18 — End: 2022-03-18
  Administered 2022-03-18 (×2): 0.5 mg via INTRAVENOUS

## 2022-03-18 MED ORDER — HYDRALAZINE HCL 20 MG/ML IJ SOLN
20 MG/ML | INTRAMUSCULAR | Status: DC | PRN
Start: 2022-03-18 — End: 2022-03-18

## 2022-03-18 MED ORDER — LACTATED RINGERS IV SOLN
INTRAVENOUS | Status: DC
Start: 2022-03-18 — End: 2022-03-18

## 2022-03-18 MED ORDER — DEXMEDETOMIDINE HCL 200 MCG/2ML IV SOLN
200 MCG/2ML | INTRAVENOUS | Status: DC | PRN
Start: 2022-03-18 — End: 2022-03-18
  Administered 2022-03-18: 11:00:00 30 via INTRAVENOUS

## 2022-03-18 MED ORDER — LIDOCAINE HCL (PF) 1 % IJ SOLN
1 % | Freq: Once | INTRAMUSCULAR | Status: AC | PRN
Start: 2022-03-18 — End: 2022-03-18
  Administered 2022-03-18: 11:00:00 1 mL via INTRADERMAL

## 2022-03-18 MED ORDER — ACETAMINOPHEN 500 MG PO TABS
500 MG | Freq: Once | ORAL | Status: AC
Start: 2022-03-18 — End: 2022-03-18
  Administered 2022-03-18: 11:00:00 1000 mg via ORAL

## 2022-03-18 MED ORDER — OXYCODONE HCL 5 MG PO TABS
5 MG | ORAL | Status: AC | PRN
Start: 2022-03-18 — End: 2022-03-19
  Administered 2022-03-18 – 2022-03-19 (×5): 10 mg via ORAL

## 2022-03-18 MED ORDER — DEXAMETHASONE SODIUM PHOSPHATE 20 MG/5ML IJ SOLN
20 MG/5ML | INTRAMUSCULAR | Status: DC | PRN
Start: 2022-03-18 — End: 2022-03-18
  Administered 2022-03-18: 12:00:00 12 via INTRAVENOUS

## 2022-03-18 MED ORDER — FAMOTIDINE (PF) 20 MG/2ML IV SOLN
20 MG/2ML | INTRAVENOUS | Status: DC | PRN
Start: 2022-03-18 — End: 2022-03-18
  Administered 2022-03-18: 12:00:00 20 via INTRAVENOUS

## 2022-03-18 MED ORDER — OXYCODONE HCL 5 MG PO TABS
5 | ORAL | Status: DC | PRN
Start: 2022-03-18 — End: 2022-03-19

## 2022-03-18 MED ORDER — BISACODYL 5 MG PO TBEC
5 MG | Freq: Every day | ORAL | Status: AC | PRN
Start: 2022-03-18 — End: 2022-03-19

## 2022-03-18 MED ORDER — SUGAMMADEX SODIUM 200 MG/2ML IV SOLN
200 MG/2ML | INTRAVENOUS | Status: DC | PRN
Start: 2022-03-18 — End: 2022-03-18
  Administered 2022-03-18: 13:00:00 200 via INTRAVENOUS

## 2022-03-18 MED ORDER — BUPIVACAINE-EPINEPHRINE (PF) 0.25% -1:200000 IJ SOLN
INTRAMUSCULAR | Status: DC | PRN
Start: 2022-03-18 — End: 2022-03-18
  Administered 2022-03-18: 13:00:00 40 via PERIARTICULAR

## 2022-03-18 MED ORDER — MIDAZOLAM HCL 2 MG/2ML IJ SOLN
2 MG/ML | INTRAMUSCULAR | Status: DC | PRN
Start: 2022-03-18 — End: 2022-03-18
  Administered 2022-03-18: 11:00:00 2 via INTRAVENOUS

## 2022-03-18 MED ORDER — DEXMEDETOMIDINE HCL 200 MCG/2ML IV SOLN
200 MCG/2ML | INTRAVENOUS | Status: AC
Start: 2022-03-18 — End: ?

## 2022-03-18 MED ORDER — ROCURONIUM BROMIDE 50 MG/5ML IV SOLN
50 MG/5ML | INTRAVENOUS | Status: AC
Start: 2022-03-18 — End: ?

## 2022-03-18 MED ORDER — HYDROMORPHONE HCL 1 MG/ML IJ SOLN
1 MG/ML | INTRAMUSCULAR | Status: DC | PRN
Start: 2022-03-18 — End: 2022-03-18
  Administered 2022-03-18 (×2): 1 via INTRAVENOUS

## 2022-03-18 MED ORDER — DEXAMETHASONE SODIUM PHOSPHATE 20 MG/5ML IJ SOLN
20 MG/5ML | INTRAMUSCULAR | Status: AC
Start: 2022-03-18 — End: ?

## 2022-03-18 MED ORDER — MELOXICAM 7.5 MG PO TABS
7.5 MG | Freq: Every day | ORAL | Status: AC
Start: 2022-03-18 — End: 2022-03-21
  Administered 2022-03-18 – 2022-03-19 (×2): 15 mg via ORAL

## 2022-03-18 MED ORDER — ONDANSETRON HCL 4 MG/2ML IJ SOLN
4 MG/2ML | INTRAMUSCULAR | Status: AC
Start: 2022-03-18 — End: ?

## 2022-03-18 MED ORDER — ACETAMINOPHEN 500 MG PO TABS
500 MG | Freq: Three times a day (TID) | ORAL | Status: AC
Start: 2022-03-18 — End: 2022-03-19
  Administered 2022-03-18 – 2022-03-19 (×3): 1000 mg via ORAL

## 2022-03-18 MED ORDER — DIPHENHYDRAMINE HCL 25 MG PO CAPS
25 MG | Freq: Four times a day (QID) | ORAL | Status: AC | PRN
Start: 2022-03-18 — End: 2022-03-19

## 2022-03-18 MED ORDER — DIPHENHYDRAMINE HCL 50 MG/ML IJ SOLN
50 MG/ML | Freq: Four times a day (QID) | INTRAMUSCULAR | Status: AC | PRN
Start: 2022-03-18 — End: 2022-03-19

## 2022-03-18 MED ORDER — ZINC GLUCONATE 50 MG PO TABS
50 MG | Freq: Every day | ORAL | Status: AC
Start: 2022-03-18 — End: 2022-03-19
  Administered 2022-03-18 – 2022-03-19 (×2): 50 mg via ORAL

## 2022-03-18 MED ORDER — LIDOCAINE HCL (PF) 2 % IJ SOLN
2 % | INTRAMUSCULAR | Status: DC | PRN
Start: 2022-03-18 — End: 2022-03-18
  Administered 2022-03-18: 12:00:00 50 via INTRAVENOUS

## 2022-03-18 MED ORDER — PROPOFOL 200 MG/20ML IV EMUL
200 MG/20ML | INTRAVENOUS | Status: AC
Start: 2022-03-18 — End: ?

## 2022-03-18 MED ORDER — CEFAZOLIN SODIUM 2 G IJ SOLR
2 g | INTRAMUSCULAR | Status: AC
Start: 2022-03-18 — End: 2022-03-18
  Administered 2022-03-18: 12:00:00 2000 mg via INTRAVENOUS

## 2022-03-18 MED ORDER — GABAPENTIN 300 MG PO CAPS
300 MG | Freq: Once | ORAL | Status: AC
Start: 2022-03-18 — End: 2022-03-18
  Administered 2022-03-18: 11:00:00 300 mg via ORAL

## 2022-03-18 MED ORDER — LACTATED RINGERS IV SOLN
INTRAVENOUS | Status: DC
Start: 2022-03-18 — End: 2022-03-18
  Administered 2022-03-18: 11:00:00 via INTRAVENOUS

## 2022-03-18 MED ORDER — FAMOTIDINE 20 MG PO TABS
20 MG | Freq: Two times a day (BID) | ORAL | Status: AC
Start: 2022-03-18 — End: 2022-03-19
  Administered 2022-03-18 – 2022-03-19 (×2): 20 mg via ORAL

## 2022-03-18 MED ORDER — HYDROMORPHONE HCL 1 MG/ML IJ SOLN
1 MG/ML | INTRAMUSCULAR | Status: AC
Start: 2022-03-18 — End: ?

## 2022-03-18 MED ORDER — NORMAL SALINE FLUSH 0.9 % IV SOLN
0.9 % | Freq: Two times a day (BID) | INTRAVENOUS | Status: AC
Start: 2022-03-18 — End: 2022-03-19

## 2022-03-18 MED ORDER — ONDANSETRON HCL 4 MG/2ML IJ SOLN
4 MG/2ML | INTRAMUSCULAR | Status: DC | PRN
Start: 2022-03-18 — End: 2022-03-18
  Administered 2022-03-18: 12:00:00 4 via INTRAVENOUS

## 2022-03-18 MED ORDER — MAGNESIUM HYDROXIDE 400 MG/5ML PO SUSP
400 MG/5ML | Freq: Every day | ORAL | Status: AC | PRN
Start: 2022-03-18 — End: 2022-03-19

## 2022-03-18 MED ORDER — BISACODYL 5 MG PO TBEC
5 MG | Freq: Every day | ORAL | Status: AC
Start: 2022-03-18 — End: 2022-03-19
  Administered 2022-03-18 – 2022-03-19 (×2): 5 mg via ORAL

## 2022-03-18 MED ORDER — PROPOFOL 200 MG/20ML IV EMUL
200 MG/20ML | INTRAVENOUS | Status: DC | PRN
Start: 2022-03-18 — End: 2022-03-18
  Administered 2022-03-18: 12:00:00 50 via INTRAVENOUS
  Administered 2022-03-18: 12:00:00 120 via INTRAVENOUS

## 2022-03-18 MED ORDER — BUPIVACAINE-EPINEPHRINE (PF) 0.25% -1:200000 IJ SOLN
INTRAMUSCULAR | Status: AC
Start: 2022-03-18 — End: ?

## 2022-03-18 MED ORDER — HYDROMORPHONE HCL 1 MG/ML IJ SOLN
1 | INTRAMUSCULAR | Status: DC | PRN
Start: 2022-03-18 — End: 2022-03-19

## 2022-03-18 MED ORDER — SODIUM CHLORIDE 0.9 % IV SOLN
0.9 % | INTRAVENOUS | Status: DC | PRN
Start: 2022-03-18 — End: 2022-03-18

## 2022-03-18 MED ORDER — PROPOFOL 1000 MG/100ML IV EMUL
1000 MG/100ML | INTRAVENOUS | Status: AC
Start: 2022-03-18 — End: ?

## 2022-03-18 MED ORDER — ASPIRIN 325 MG PO TBEC
325 MG | Freq: Two times a day (BID) | ORAL | Status: AC
Start: 2022-03-18 — End: 2022-03-19
  Administered 2022-03-18 – 2022-03-19 (×3): 325 mg via ORAL

## 2022-03-18 MED ORDER — ONDANSETRON HCL 4 MG/2ML IJ SOLN
4 MG/2ML | Freq: Four times a day (QID) | INTRAMUSCULAR | Status: AC | PRN
Start: 2022-03-18 — End: 2022-03-19

## 2022-03-18 MED ORDER — MIDAZOLAM HCL 2 MG/2ML IJ SOLN
2 MG/ML | INTRAMUSCULAR | Status: AC
Start: 2022-03-18 — End: ?

## 2022-03-18 MED ORDER — MELOXICAM 7.5 MG PO TABS
7.5 MG | Freq: Once | ORAL | Status: AC
Start: 2022-03-18 — End: 2022-03-18
  Administered 2022-03-18: 11:00:00 15 mg via ORAL

## 2022-03-18 MED ORDER — FAMOTIDINE (PF) 20 MG/2ML IV SOLN
20 MG/2ML | INTRAVENOUS | Status: AC
Start: 2022-03-18 — End: ?

## 2022-03-18 MED ORDER — NORMAL SALINE FLUSH 0.9 % IV SOLN
0.9 % | Freq: Two times a day (BID) | INTRAVENOUS | Status: DC
Start: 2022-03-18 — End: 2022-03-18

## 2022-03-18 MED ORDER — KETAMINE HCL 50 MG/ML IJ SOSY
50 MG/ML | INTRAMUSCULAR | Status: AC
Start: 2022-03-18 — End: ?

## 2022-03-18 MED ORDER — NORMAL SALINE FLUSH 0.9 % IV SOLN
0.9 % | INTRAVENOUS | Status: DC | PRN
Start: 2022-03-18 — End: 2022-03-18

## 2022-03-18 MED ORDER — KETAMINE HCL 50 MG/ML IJ SOSY
50 MG/ML | INTRAMUSCULAR | Status: DC | PRN
Start: 2022-03-18 — End: 2022-03-18
  Administered 2022-03-18: 12:00:00 50 via INTRAVENOUS

## 2022-03-18 MED FILL — FAMOTIDINE (PF) 20 MG/2ML IV SOLN: 20 MG/2ML | INTRAVENOUS | Qty: 2

## 2022-03-18 MED FILL — OXYCODONE HCL 5 MG PO TABS: 5 MG | ORAL | Qty: 2

## 2022-03-18 MED FILL — FAMOTIDINE (PF) 20 MG/2ML IV SOLN: 20 MG/2ML | INTRAVENOUS | Qty: 8

## 2022-03-18 MED FILL — ACETAMINOPHEN EXTRA STRENGTH 500 MG PO TABS: 500 MG | ORAL | Qty: 2

## 2022-03-18 MED FILL — CEFAZOLIN SODIUM 2 G IJ SOLR: 2 g | INTRAMUSCULAR | Qty: 2000

## 2022-03-18 MED FILL — ASPIRIN EC 325 MG PO TBEC: 325 MG | ORAL | Qty: 1

## 2022-03-18 MED FILL — EXPAREL 1.3 % IJ SUSP: 1.3 % | INTRAMUSCULAR | Qty: 20

## 2022-03-18 MED FILL — ROCURONIUM BROMIDE 50 MG/5ML IV SOLN: 50 MG/5ML | INTRAVENOUS | Qty: 10

## 2022-03-18 MED FILL — DIPRIVAN 1000 MG/100ML IV EMUL: 1000 MG/100ML | INTRAVENOUS | Qty: 400

## 2022-03-18 MED FILL — SODIUM CHLORIDE FLUSH 0.9 % IV SOLN: 0.9 % | INTRAVENOUS | Qty: 10

## 2022-03-18 MED FILL — NALOXONE HCL 2 MG/2ML IJ SOSY: 2 MG/ML | INTRAMUSCULAR | Qty: 2

## 2022-03-18 MED FILL — ONDANSETRON HCL 4 MG/2ML IJ SOLN: 4 MG/2ML | INTRAMUSCULAR | Qty: 2

## 2022-03-18 MED FILL — ZINC GLUCONATE 50 MG PO TABS: 50 MG | ORAL | Qty: 1

## 2022-03-18 MED FILL — FENTANYL CITRATE (PF) 100 MCG/2ML IJ SOLN: 100 MCG/2ML | INTRAMUSCULAR | Qty: 2

## 2022-03-18 MED FILL — DEXAMETHASONE SODIUM PHOSPHATE 20 MG/5ML IJ SOLN: 20 MG/5ML | INTRAMUSCULAR | Qty: 5

## 2022-03-18 MED FILL — BRIDION 200 MG/2ML IV SOLN: 200 MG/2ML | INTRAVENOUS | Qty: 2

## 2022-03-18 MED FILL — SENSORCAINE-MPF/EPINEPHRINE 0.25% -1:200000 IJ SOLN: INTRAMUSCULAR | Qty: 30

## 2022-03-18 MED FILL — PROPOFOL 200 MG/20ML IV EMUL: 200 MG/20ML | INTRAVENOUS | Qty: 20

## 2022-03-18 MED FILL — BISACODYL EC 5 MG PO TBEC: 5 MG | ORAL | Qty: 1

## 2022-03-18 MED FILL — NALOXONE HCL 0.4 MG/ML IJ SOLN: 0.4 MG/ML | INTRAMUSCULAR | Qty: 1

## 2022-03-18 MED FILL — DILAUDID 1 MG/ML IJ SOLN: 1 MG/ML | INTRAMUSCULAR | Qty: 1

## 2022-03-18 MED FILL — GABAPENTIN 300 MG PO CAPS: 300 MG | ORAL | Qty: 1

## 2022-03-18 MED FILL — LACTATED RINGERS IV SOLN: INTRAVENOUS | Qty: 1000

## 2022-03-18 MED FILL — FAMOTIDINE 20 MG PO TABS: 20 MG | ORAL | Qty: 1

## 2022-03-18 MED FILL — TRANEXAMIC ACID 1000 MG/10ML IV SOLN: 1000 MG/10ML | INTRAVENOUS | Qty: 10

## 2022-03-18 MED FILL — XYLOCAINE-MPF 2 % IJ SOLN: 2 % | INTRAMUSCULAR | Qty: 5

## 2022-03-18 MED FILL — KETAMINE HCL 50 MG/ML IJ SOSY: 50 MG/ML | INTRAMUSCULAR | Qty: 1

## 2022-03-18 MED FILL — MELOXICAM 7.5 MG PO TABS: 7.5 MG | ORAL | Qty: 2

## 2022-03-18 MED FILL — DEXMEDETOMIDINE HCL 200 MCG/2ML IV SOLN: 200 MCG/2ML | INTRAVENOUS | Qty: 2

## 2022-03-18 MED FILL — MIDAZOLAM HCL 2 MG/2ML IJ SOLN: 2 MG/ML | INTRAMUSCULAR | Qty: 2

## 2022-03-18 MED FILL — NOREPINEPHRINE BITARTRATE 1 MG/ML IV SOLN: 1 MG/ML | INTRAVENOUS | Qty: 4

## 2022-03-18 NOTE — Progress Notes (Signed)
TRANSFER - OUT REPORT:    Verbal report given to Simeone, RN on Jose Esparza  being transferred to 2128 for routine progression of patient care       Report consisted of patient's Situation, Background, Assessment and   Recommendations(SBAR).     Information from the following report(s) Nurse Handoff Report, Surgery Report, Intake/Output, and MAR was reviewed with the receiving nurse.    Opportunity for questions and clarification was provided.      Patient transported with:          Nursing assistant via bed. Pt on 2 LPM NC, vitals stable

## 2022-03-18 NOTE — Progress Notes (Signed)
Pt's ride Jordanny, [place] (Spouse)   [phone] (Mobile).

## 2022-03-18 NOTE — Plan of Care (Signed)
Problem: ABCDS Injury Assessment  Goal: Absence of physical injury  Outcome: Progressing

## 2022-03-18 NOTE — Other (Signed)
Pre-Operative Patient Rounding and Family Updates        [x]    Hourly rounding    [x]    Offered toileting    [x]    Call bell with in reach    Update given to:       []   Patient                []   Family     Update related to:                        []  Procedure time                [] Procedure delay                               Other:

## 2022-03-18 NOTE — Progress Notes (Signed)
PHYSICAL THERAPY EVALUATION     Acknowledge Orders  Time  PT Charge Capture  Rehab Caseload Tracker  Tinetti  -    Patient: Jose Esparza (61 y.o. male)  Room: 2128/2128    Primary Diagnosis: Osteoarthritis of left hip, unspecified osteoarthritis type [M16.12]  Primary localized osteoarthritis of left hip [M16.12]   Procedure(s) (LRB):  LEFT HIP ARTHROPLASTY; ANTERIOR (Left) Day of Surgery  Date of Admission: 03/18/2022   Length of Stay:  0 day(s)  Insurance: Payor: [employer] / Plan: [employer] / Product Type: *No Product type* /      Date: 03/18/2022  Time In: 1408  Time Out: 1452   Total Minutes: 44  Treatment Time: Patient received/participated in 35 minutes of treatment  functional mobility training, bed mobility training, transfer training, gait training, balance training, therapeutic exercises, therapeutic activities, AD training, patient education) during/immediately following PT evaluation.    Isolation:  No active isolations       MDRO: No active infections  Current diet order: ADULT DIET; Regular    Precautions: falls, Anterior THR:  no formal movement restrictions    Ordered weight bearing status: weight bearing as tolerated LLE    ASSESSMENT:      Based on the objective data described below, the patient presents with    - decreased mobility  - decreased independence with functional mobility  - good tolerance to sustained activity  - post op pain adequately controlled  - active participation in therapeutic exercises and activities  - active participation in gait training: 100 feet with rolling walker and SBA  - good safety awareness  - + motivation to participate in PT to improve functional activity tolerance and return to prior level of function    Patient's rehabilitation potential for below stated goals: good    Recommendations:  Recommend continued physical therapy during acute stay. Occupational Therapy. Recommend out of bed activity to counteract ill effects of bedrest, with assistance  from staff as needed.  Discharge Recommendations:   Home with family support and HHPT.  Further Equipment Recommendations for Discharge: Patient requires a rolling walker to provide patient safety and increased independence in mobility, which cannot be achieved by a cane. Furthermore, a rolling walker also provides a wider base of support to reduce risk for falls.  Marland Kitchen       PRIOR LEVEL OF FUNCTION / HOME ENVIRONMENT:      Information was obtained by patient  Home environment: Patient lives with family in a 2 story home. Bedroom is on the second floor. 5 steps to enter. (+) rails.   Prior level of function: independent   Home equipment: None reported     PLAN:      Patient will benefit from skilled Physical Therapy intervention to address the above impairments to return to prior level of function. Patient will be seen BID Mon-Fri and once a day on weekends/holidays.    Patient will achieve PT goals in 1 week. Goals were created with input from the patient.    PHYSICAL THERAPY GOALS:     - Patient will be independent with bed mobility in preparation for EOB activities.   - Patient will be independent with transfers in preparation for OOB activities and ambulation.  - Patient will ambulate at the modified independent level for 150 feet with the least restrictive device to promote functional independence at home.   - Patient will ascend/descend flight of stairs with handrail(s) at the modified independent level to enter home  and/or go to upstairs bedroom/bathroom.  - Patient will demonstrate good balance to safely enable upright activities and reduce risk for falls.  - Patient will demonstrate good safety awareness during functional activities.  - Patient will be modified independent with lower extremity home exercise program to increase strength and endurance.  - Patient will state/observe fall and anterior THR precautions.      PLANNED INTERVENTIONS:      Skilled Physical Therapy services will provide functional  mobility training, therapeutic exercises, therapeutic activities, patient/caregiver education as indicated.  Skilled Physical Therapy services will modify and progress therapeutic interventions, address functional mobility deficits, address ROM and strength deficits, analyze and cue movement patterns and assess and modify postural abnormalities to reach the stated goals.    COMMUNICATION/EDUCATION:     Education: Ill effects of bedrest, benefits of activity, OOB activity as tolerated, with assist from staff as appropriate, OOB to chair for meals and mobilize as tolerated, activity as tolerated to counteract ill effects of bedrest, promote healing and return to prior level of function, call staff for assistance, safety, activity pacing, ICS use 10 times every hour while awake, HEP, ankle pumps to promote improved circulation and prevent DVTs, hip precautions, positioning, allowing for time to adjust to changes in position, disposition, role of PT, PT plan of care   Education provided to:  patient. Opportunity for questions and clarification was provided.   Readiness to learn indicated by: verbalized understanding, successful return demonstration, and appropriate questions asked   Barriers to learning/limitations: none   Comprehension: Patient communicated comprehension     SUBJECTIVE:      Patient "This is amazing.  The pain is gone"  Pain     3/10 surgical    OBJECTIVE DATA SUMMARY:      Orders, labs, and chart reviewed on Juwuan D Skeet. Communicated with patient's nurse (patient ok to be seen by PT).     Present illness history:   Patient Active Problem List    Diagnosis Date Noted    Primary localized osteoarthritis of left hip 03/18/2022    Osteoarthritis of left hip, unspecified osteoarthritis type 03/18/2022      Previous medical history:   Past Medical History:   Diagnosis Date    History of esophageal spasm     Hyperlipidemia     Melanoma (HCC) 2015    neck    Primary osteoarthritis of left hip      Sarcoidosis     no nodules presently          COGNITIVE STATUS:     Mental Status:  normal mood, behavior, and thought processes, able to follow commands, alert and oriented x 4   Communication:  speech and language intact, patient interacts appropriately   Follows commands:  intact   General cognition:  mood and affect normal, appropriate to situation.    Safety/Judgement:  appropriate awareness of environment and need for assistance as well as awareness of precautions     EXTREMITIES ASSESSMENT:      Strength:  BUE -  WFL  BLE -  WFL  L hip 3/5    Range of Motion:  BUE -  WFL  BLE -  WFL    Sensation:  Intact to light touch    THERAPEUTIC ACTIVITIES; FUNCTIONAL MOBILITY AND BALANCE STATUS:      Bed mobility:  modI w/ use of bedrail    Transfers:  Supine <> Sit  inst for tech.  Inc effort to move BLE to  EOB   CGA/SBA performed x 2  Sit <> Stand  CGA w/ inst for tech from bed x3      Functional Balance Sitting: fair (+): maintains balance with independence without cueing   Functional Balance Standing:  fair (+): maintains balance with independence without cueing, needs assistive device     Gait Training:  Pt instructed in and demonstrated in proper use of RW  100 feet w/ RW and gait belt, SBA.   Inst for heel strike and even strides.  No LOB    Pt amb an additional 15' x2 into BR.  Able to stand and urinate w/out dfficulty    Functional Outcome Measure:  Functional Status Score       Task   Score    1. Rolling  6 - Use of bed rail or object to pull on   2. Supine to Sit Transfer  6 - Use of bed rail or another object to pull on for support   3. Sit to Stand Transfer  6 - Use of bed rail, armrests, or another object to pull on for support   4. Sitting Edge of Bed  7 - Independent, maintains hands free   5. Walking  2 - Walks 50+ feet with the assistance of only 1 person      TOTAL SCORE:  27        INITIAL TOTAL SCORE:  27             THERAPEUTIC EXERCISES:         EXERCISE (Rationale: increase ROM, increase  strength, improve coordination to improve the patient's functional mobility)   Sets   Reps   Active Active Assist   Passive    Ankle Pumps BLE 1 20 [x]  []  []     Quad Sets/Glut Sets BLE 1 10 [x]  []  []     Heel Slides 1 10 [x]  []  []     Hip Abd/Add 1 10 [x]  []  []     Long Arc Quads BLE 1 10 [x]  []  []     Marching BLE 1 10 [x]  []  []      Inst for slow controlled mvmts through FROM.  Perform AP during each commercial break    ACTIVITY TOLERANCE:     - good tolerance to activity during PT session  - motivated to increase activity  - pain is well managed    FINAL LOCATION:     Positioned in bed, all needs within reach. Patient agrees to call for assistance. As found. Patient set up with tray. (+) bed alarm. (+) nurse notified.    Thank you for this referral.  Norval GableLana Savaya Hakes, PT, PT

## 2022-03-18 NOTE — Other (Signed)
Met with Jose Esparza in [facility] briefly.  Reviewed some Joint Replacement Class information.  Patient denies questions or concerns at this time.

## 2022-03-18 NOTE — Interval H&P Note (Signed)
Update History & Physical    The patient's History and Physical of Mar 12, 2022 was reviewed with the patient and I examined the patient. There was no change. The surgical site was confirmed by the patient and me.     Plan: The risks, benefits, expected outcome, and alternative to the recommended procedure have been discussed with the patient. Patient understands and wants to proceed with the procedure.     Electronically signed by Caryl Ada, MD on 03/18/2022 at 7:24 AM

## 2022-03-18 NOTE — Care Coordination-Inpatient (Signed)
03/18/22 1225   Service Assessment   Patient Orientation Alert and Oriented;Person;Place;Situation;Self   Cognition Alert   History Provided By Patient;Spouse   Primary Caregiver Self   Accompanied By/Relationship Spouse   Support Systems Spouse/Significant Other   PCP Verified by CM Yes  Jose Esparza)   Prior Functional Level Independent in ADLs/IADLs   Current Functional Level Mobility;Assistance with the following:   Can patient return to prior living arrangement Yes   Ability to make needs known: Good   Family able to assist with home care needs: Yes   Would you like for me to discuss the discharge plan with any other family members/significant others, and if so, who? Yes  (Spouse [name] [phone])   Social/Functional History   Lives With Spouse   Type of Home House   Home Layout Two level   Home Access Stairs to enter with rails   Entrance Stairs - Number of Steps 5   Receives Help From Family   ADL Assistance Needs assistance   Toileting Needs assistance   Homemaking Assistance Needs assistance   Ambulation Assistance Needs assistance   Transfer Assistance Needs assistance   Discharge Planning   Type of Residence House   Living Arrangements Spouse/Significant Other   Potential Assistance Needed Durable Medical Equipment   Potential DME Needed Walker   Patient expects to be discharged to: [facility] At/After Discharge   Transition of Care Consult (CM Consult) Home Health;Discharge Planning   Internal Home Health No  [name] per MD Delford Field)   Reason Outside Agency Chosen Physician referred to specific agency   Mode of Transport at Discharge Other (see comment)  (Spouse)   Confirm Follow Up Transport Family   Condition of Participation: Discharge Planning   The Plan for Transition of Care is related to the following treatment goals: Rteurn to PLOF/ IADLS   The Patient and/or Patient Representative was provided with a Choice of Provider? Patient   The Patient and/Or Patient  Representative agree with the Discharge Plan? Yes   Freedom of Choice list was provided with basic dialogue that supports the patient's individualized plan of care/goals, treatment preferences, and shares the quality data associated with the providers?  Yes

## 2022-03-18 NOTE — Op Note (Signed)
Operative Note      Patient: Jose Esparza  Date of Birth: 1961-08-29  MRN: [id number]    Date of Procedure: 03/22/22    Pre-Op Diagnosis Codes:     * Osteoarthritis of left hip, unspecified osteoarthritis type [M16.12]    Post-Op Diagnosis: Same       Procedure(s):  LEFT HIP ARTHROPLASTY; ANTERIOR    Surgeon(s):  Caryl Ada, MD    Assistant:   Surgical Assistant: Terrilyn Saver  Physician Assistant: Rhunette Croft, PA-C    Anesthesia: General    Estimated Blood Loss (mL): 250    Complications: None    Specimens:   * No specimens in log *    Implants:  Implant Name Type Inv. Item Serial No. Manufacturer Lot No. LRB No. Used Action   SCREW HEX LOW PROFILE 6.5X25MM - [id number] Screw/Plate/Nail/Rod SCREW HEX LOW PROFILE 6.5X25MM  [employer] HOWM[employer]CR [id number] Left 1 Implanted   HEX DOME HOLE PLUG - [id number] Synthetics HEX DOME HOLE PLUG  [employer] HOWM[employer]CR [id number] Left 1 Implanted   SHELL ACETABULAR CLUSTERHOLE TRIDENT II SZ E - [id number] Joint Component SHELL ACETABULAR CLUSTERHOLE TRIDENT II SZ E  [employer] HOWM[employer]CR [id number] Left 1 Implanted   SCREW HEX LOW PROFILE 6.5X25MM - [id number] Screw/Plate/Nail/Rod SCREW HEX LOW PROFILE 6.5X25MM  [employer] HOW[employer] [id number] Left 1 Implanted   INSERT POLY TRIDENT SZ E - [id number] Joint Component INSERT POLY TRIDENT SZ E  [employer] HOWM[employer]CR [id number] Left 1 Implanted   STEM FEMORAL ACCOLADE II SZ 7 127 DEG - [id number] Joint Component STEM FEMORAL ACCOLADE II SZ 7 127 DEG  [employer] HOWM[employer]CR [id number] Left 1 Implanted   HEAD FEMORAL BIOLOX DELTA +4MM - [id number] Joint Component HEAD FEMORAL BIOLOX DELTA +4MM  [employer] HOWM[employer]CR [id number] Left 1 Implanted         Drains: * No LDAs found *    Findings: end stage arthritis left hip      Detailed Description of Procedure:        After informed consent was obtained, the left hip was identified as the correct operative site and  signed.  The patient was brought back to the operating room. After appropriate anesthesia was performed, the patient was positioned on the [facility] operating room table with her legs in traction boots.  Fluoroscopy was used to ensure we could obtain appropriate views the hip and pelvis.  The operative site was prepped and draped in the usual sterile fashion.  Surgical pause was held confirming the right  leg was the correct leg to be operated on.  Prophylactic antibiotics were given.      An anterior incision was used on the left hip, carried down through subcutaneous tissues to the level of the tensor lata fascia.  This was incised.  The tensor lata muscle was separated from the fascia bluntly and the muscle was retracted laterally.  This gained exposure to the deep fascia.  Crossing vessels were electrocauterized and the deep fascia was opened exposing the aspect of the hip joint.  A retractor was placed over the superior capsule.  A second retractor was placed under the inferior capsule and then, the dissection was carried medially underneath the rectus and retractor placed medially into the pelvis.    A T-capsulotomy was made and the inferior and superior flaps were tagged for later repair.  The superior flap was released over top of the femoral neck.  The inferior flap  released inferiorly to the lesser troch and medially, a release to remove some of the remaining labrum was made.  The retractors were repositioned inside the joint capsule.  The femoral neck osteotomy was performed with a saw.  The height was checked with fluoroscopy prior making the cut.  A corkscrew device was used to remove the femoral head and neck from the wound.    The retractors were then repositioned around the acetabulum giving excellent exposure and the remaining labrum was removed as well as the ligamentum teres.  The acetabulum was sequentially reamed up to a 52 mm reamer.  This gave an excellent circumferential ream.  A 52 mm cup was  impacted into the acetabulum in appropriate version and abduction.  Placement of the real cup was done under fluoroscopy to ensure adequate positioning.  Two screws were placed into the shell to help with initial fixation, although good press-fit had been obtained.  A real liner was then impacted into the cup and noted to seat well.  Retractors were then removed from the acetabulum.   The femoral neck was then exposed using a retractor medial to the neck.  The hip was externally rotated to 90 degrees and the release over the femoral neck was continued up the medial aspect of the greater trochanter.  A Mueller retractor was placed over the greater trochanter and the hip was externally rotated to 120 degrees.  The leg was fully extended and abducted to 40 degrees gaining excellent visualization of the femoral canal.      A box osteotome was used to enter the femoral canal.  A canal finder was used to identify the location and orientation of the canal.  We sequentially broached the proximal femur up to a size 7.  This gave [place] excellent stability.  A trial reduction was performed with a 127-degree neck and a +0 head ball.  With this combination in place, the patient had good stability at 90 degrees as well as appropriate tension on the soft tissues.  Fluoroscopy revealed the leg to be slightly shorter than the opposite side.  Leg length was equal with a +4 head and offset was better matched as well.  The hip was dislocated, externally rotated, extended and adducted.  The femoral head, neck and broach were removed from the femur and then the real stem impacted into the femur.  A trial reduction with +4 head ball was performed and resulted in a similar reduction as before.  The hip was then dislocated.  The trial head ball removed.  The trunnion was cleaned and the real head ball impacted onto the stem.  The final reduction was performed.      A dilute chlorhexidene solution soak was performed.  Final irrigation of the  joint was performed.  Marcaine with epinephrine and exaprel was injected into the soft tissues for post op pain control.  The capsular flaps were closed using Ethibond suture.  The tensor lata fascia was closed with Stratafix.   0 Vicryl, 2-0 Vicryl, 4-0 Monocryl were used to closed the subcutaneous and skin along with a Prineo dressing.  Sterile dressing was placed on top of this.  He tolerated the procedure well, was escorted to recovery room in stable condition.              Electronically signed by Caryl Ada, MD on 03/18/2022 at 8:46 AM

## 2022-03-19 LAB — CBC
Hematocrit: 37.3 % (ref 36.0–55.0)
Hemoglobin: 12.8 gm/dl — ABNORMAL LOW (ref 13.0–18.0)
MCH: 32.4 pg (ref 23.0–34.6)
MCHC: 34.3 gm/dl (ref 30.0–36.0)
MCV: 94.4 fL (ref 80.0–98.0)
MPV: 11.9 fL — ABNORMAL HIGH (ref 6.0–10.0)
Platelets: 183 10*3/uL (ref 140–450)
RBC: 3.95 M/uL (ref 3.80–5.70)
RDW: 42.5 (ref 35.1–43.9)
WBC: 13.8 10*3/uL — ABNORMAL HIGH (ref 4.0–11.0)

## 2022-03-19 LAB — BASIC METABOLIC PANEL
Anion Gap: 6 mmol/L (ref 5–15)
BUN: 9 mg/dl (ref 9–23)
CO2: 30 mEq/L (ref 20–31)
Calcium: 9.5 mg/dl (ref 8.7–10.4)
Chloride: 105 mEq/L (ref 98–107)
Creatinine: 0.83 mg/dl (ref 0.70–1.30)
GFR African American: >60
GFR Non-African American: >60
Glucose: 114 mg/dl — ABNORMAL HIGH (ref 74–106)
Potassium: 3.7 mEq/L (ref 3.5–5.1)
Sodium: 141 mEq/L (ref 136–145)

## 2022-03-19 MED ORDER — ASPIRIN 325 MG PO TBEC
325 MG | ORAL_TABLET | Freq: Two times a day (BID) | ORAL | 0 refills | Status: AC
Start: 2022-03-19 — End: ?

## 2022-03-19 MED ORDER — ACETAMINOPHEN 500 MG PO TABS
500 MG | ORAL_TABLET | Freq: Four times a day (QID) | ORAL | 1 refills | Status: AC | PRN
Start: 2022-03-19 — End: ?

## 2022-03-19 MED ORDER — OXYCODONE HCL 5 MG PO TABS
5 MG | ORAL_TABLET | ORAL | 0 refills | Status: AC | PRN
Start: 2022-03-19 — End: 2022-03-22

## 2022-03-19 MED ORDER — MELOXICAM 15 MG PO TABS
15 MG | ORAL_TABLET | Freq: Every day | ORAL | 2 refills | Status: AC
Start: 2022-03-19 — End: ?

## 2022-03-19 MED FILL — ATORVASTATIN CALCIUM 40 MG PO TABS: 40 MG | ORAL | Qty: 1

## 2022-03-19 MED FILL — ASPIRIN EC 325 MG PO TBEC: 325 MG | ORAL | Qty: 1

## 2022-03-19 MED FILL — SODIUM CHLORIDE FLUSH 0.9 % IV SOLN: 0.9 % | INTRAVENOUS | Qty: 10

## 2022-03-19 MED FILL — OXYCODONE HCL 5 MG PO TABS: 5 MG | ORAL | Qty: 2

## 2022-03-19 MED FILL — CEFAZOLIN SODIUM 2 G IJ SOLR: 2 g | INTRAMUSCULAR | Qty: 2000

## 2022-03-19 MED FILL — FAMOTIDINE (PF) 20 MG/2ML IV SOLN: 20 MG/2ML | INTRAVENOUS | Qty: 2

## 2022-03-19 MED FILL — MELOXICAM 7.5 MG PO TABS: 7.5 MG | ORAL | Qty: 2

## 2022-03-19 MED FILL — ZINC GLUCONATE 50 MG PO TABS: 50 MG | ORAL | Qty: 1

## 2022-03-19 MED FILL — ACETAMINOPHEN EXTRA STRENGTH 500 MG PO TABS: 500 MG | ORAL | Qty: 2

## 2022-03-19 MED FILL — FAMOTIDINE 20 MG PO TABS: 20 MG | ORAL | Qty: 1

## 2022-03-19 MED FILL — BISACODYL EC 5 MG PO TBEC: 5 MG | ORAL | Qty: 1

## 2022-03-19 NOTE — Care Coordination-Inpatient (Signed)
Patient to DC home with family assistance and [facility] PT per MD Joya Gaskins.     RW ordered/ routed to [place] at [employer].     [facility] notified of Patient DC home via fax.     Family to transport home.

## 2022-03-19 NOTE — Progress Notes (Signed)
PHYSICAL THERAPY TREATMENT:   Time  PT Charge Capture  Rehab Caseload Tracker   -    Patient: Jose Esparza (61 y.o. male)  Room: 2128/2128    Primary Diagnosis: Osteoarthritis of left hip, unspecified osteoarthritis type [M16.12]  Primary localized osteoarthritis of left hip [M16.12]  Procedure(s) (LRB):  LEFT HIP ARTHROPLASTY; ANTERIOR (Left) 1 Day Post-Op   Length of Stay:  0 day(s)   Insurance: Payor: [employer] / Plan: [employer] / Product Type: *No Product type* /      Date: 03/19/2022  Time In: 0809       Time Out: 0833   Total Minutes: 24    Isolation:  No active isolations       MDRO: No active infections    Precautions: falls, Anterior THR:  no formal movement restrictions    Ordered weight bearing status: weight bearing as tolerated     ASSESSMENT:      Based on the objective data described below, the patient presents with  - safe management of stairs in preparation for home  - active participation in therapeutic exercises/activities  - motivated to increase activity    Pt motivated to work with PT. Pt mobilizing quite well.  Pt ambulated > 500 ft with walker. Pt negotiated 6 x 2 3 x 1  stairs with rails. Pt demo proper technique and safety.  Pt tolerated thera ex.     Recommendations:  Recommend continued physical therapy during acute stay. Physical Therapy and Occupational Therapy. Recommend out of bed activity to counteract ill effects of bedrest, with assistance from staff as needed.  Discharge Recommendations: Home with family support and HHPT, Outpatient PT.  Further Equipment Recommendations for Discharge: rolling walker     PLAN:      Patient will be followed by physical therapy to address goals per initial plan of care.    COMMUNICATION/EDUCATION:     Education: Ill effects of bedrest, benefits of activity, OOB to chair for meals and mobilize as tolerated, safety, HEP, ankle pumps to promote improved circulation and prevent DVTs, use of ice, positioning.    Education provided to:  patient  Opportunity for questions and clarification was provided.    Readiness to learn indicated by: verbalized understanding    Barriers to learning/limitations: none    Comprehension: Patient communicated comprehension    SUBJECTIVE:      Patient pleasantly agreeable to PT eager to go home  " can I walk outside"   Patient reports not rated /10 pain before treatment and not rated /10 pain at conclusion of treatment.  Pain Location: sx site     OBJECTIVE DATA SUMMARY:      Orders, labs, and chart reviewed on Jose Esparza. Communicated with patient's nurse (patient ok to be seen by PT).     PATIENT FOUND:     Seated in bed side chair. (+) ice pack.    COGNITIVE STATUS:     Mental Status:  Oriented x3   Communication:  normal   Follows commands:  follows multi-step complex commands/direction   General cognition:  intact   Safety/Judgement:  appropriate awareness of environment and need for assistance as well as awareness of precautions     THERAPEUTIC ACTIVITIES; FUNCTIONAL MOBILITY AND BALANCE STATUS:      Bed mobility:  Not tested    Transfers:  Sit - stand: modified independent;    Stand - sit: independent;         Gait Training:  500+ feet, independent, modified independent steady, reciprocal gait pattern, decreased walking speed, and cueing for proper body mechanics, rolling walker, gait belt    Stair Training:   number of stairs: 6 x 2    Balance:   Static sitting balance:          normal       Dynamic sitting balance:     normal       Static standing balance:      good      Dynamic standing balance: good-        THERAPEUTIC EXERCISES:      2 x 10 reps:  ankle pumps, heel slides, hip ab/duction, short arc quads (SAQ), long arc quads (LAQ), straight leg raise, glute sets, quad sets, breathing exercises        ACTIVITY TOLERANCE:     - motivated to increase activity  - pain is well managed    FINAL LOCATION:     Seated in bed side chair, all needs within reach. Patient agrees to call for assistance. Provided  ice pack. (+) nurse notified.    Thank you for this referral.  Dannielle Huh, PT

## 2022-03-19 NOTE — Other (Signed)
NAME:  Jose Esparza     Reinforced Joint Replacement Class information:  Follow all instructions from orthopedic surgeon.  Maintain surgical precautions (anterior hip). Continue exercises as taught by physical therapy.  Pain managed with medication, ice pack in place left hip.    Everyone must wash their hands prior to your care. ICS 10 breaths every hour awake (not all at once and cough when you feel need to cough) until you are home at least 5 days. Patient demonstrated properly to 4000.    Maintain normal bowel function (activity, stool softener and/or laxative).  Thigh high TED hose in place. Ankle pumps throughout the day. SCDs when in bed in hospital. Blood thinner, Aspirin twice a day, to prevent blood clots.    Patient denies questions, concerns or needs at this time.  Patient anticipates discharge home today.    Rhea Pink, RN  Surgical Patient Navigator

## 2022-03-19 NOTE — Anesthesia Post-Procedure Evaluation (Signed)
Department of Anesthesiology  Postprocedure Note    Patient: Jose Esparza  MRN: [id number]  Birthdate: [birth year]  Date of evaluation: 03/19/2022      Procedure Summary     Date: 03/18/22 Room / Location: [facility] MAIN 07 / [facility] MAIN OR    Anesthesia Start: 0727 Anesthesia Stop: 0910    Procedure: LEFT HIP ARTHROPLASTY; ANTERIOR (Left: Hip) Diagnosis:       Osteoarthritis of left hip, unspecified osteoarthritis type      (Osteoarthritis of left hip, unspecified osteoarthritis type [M16.12])    Surgeons: Caryl Ada, MD Responsible Provider: Wilder Glade, MD    Anesthesia Type: general ASA Status: 3          Anesthesia Type: No value filed.    Aldrete Phase I: Aldrete Score: 9    Aldrete Phase II:        Anesthesia Post Evaluation    Patient location during evaluation: PACU  Patient participation: complete - patient participated  Level of consciousness: awake  Pain score: 1  Airway patency: patent  Nausea & Vomiting: no nausea and no vomiting  Complications: no  Cardiovascular status: hemodynamically stable  Respiratory status: acceptable  Hydration status: euvolemic  Multimodal analgesia pain management approach

## 2022-03-19 NOTE — Progress Notes (Signed)
OCCUPATIONAL THERAPY EVALUATION     Patient: Jose Esparza (61 y.o. male)  Room: 2128/2128    Primary Diagnosis: Osteoarthritis of left hip, unspecified osteoarthritis type [M16.12]  Primary localized osteoarthritis of left hip [M16.12]   Procedure(s) (LRB):  LEFT HIP ARTHROPLASTY; ANTERIOR (Left) 1 Day Post-Op  Date of Admission: 03/18/2022   Length of Stay:  0 day(s)  Insurance: Payor: [employer] / Plan: [employer] / Product Type: *No Product type* /      Date: 03/19/2022  Time In: 0712        Time Out: 0750       Total Minutes: 38   Treatment time: 24 minutes    Isolation:  No active isolations       MDRO: No active infections    Precautions: falls, Anterior THR:  no formal movement restrictions    Ordered weight bearing status: weight bearing as tolerated LEFT lower extremity    Current diet order: ADULT DIET; Regular    Orders, labs, and chart reviewed on Jose Esparza. Communicated with nursing staff. Patient cleared to participate in Occupational Therapy.    ASSESSMENT   Based on the objective data described below, the patient presents with     -  left  lower extremity post op pain  -  direct anterior THR precautions reviewed.  patient verbalized and demonstrated understanding.  -  patient is able to perform basic self care tasks without assistance.  -  patient is modified independent for functional standing, transfers and bed mobility.   -  after patient/caregiver education/treatment, no further skilled Occupational Therapy needed during acute stay .  OT to d/c from caseload.  - + motivation to participate in OT to return to prior level of function    Patient is below their functional baseline and will benefit from skilled occupational therapy intervention in the acute care setting to address the above mentioned impairments.    Patient's rehabilitation potential is considered to be Good for below stated goals.     Recommendations:  Recommend out of bed activity to counteract ill effects of bedrest,  with assistance from staff as needed.  Recommend treatment after evaluation, then no further occupational therapy intervention during acute stay.    Discharge Recommendations:   Discharge home,    Family to provide support as needed  Equipment Recommendations for Discharge:   none identified at this time     PRIOR LEVEL OF FUNCTION     Information was obtained by: patient  Home environment: Patient lives with spouse, daughter in a 2 story home with patient's bedroom on 2nd floor.   Patient's bathroom has walk in shower and 3 in 1 bedside commode.   Prior level of function: Patient is independent with basic ADLs and ambulation.  Prior level of Instrumental Activities of Daily Living: Patient and spouse share cooking, cleaning, household chores.   Patient is employed.   Patient actively drives.   Patient has a Quarry manager.  Home equipment: 3 in 1 bedside commode    PLAN      Patient will be followed by occupational therapy 1-5x/wk x 4 wks to address goals while hospitalized as patient's status and schedule permit.    Planned interventions may include any combination of the following: patient/caregiver education and training     Patient and/or family have participated as able in goal setting and plan of care.  GOALS     OT goals initiated 03/19/2022 and will be met by patient  within 1 sessions      -  Patient and/or caregiver will be instructed in bathroom and home safety, and AE/DME options for ADLS as needed; in order to increase quality of life, reduce injury risk, and increase safety.    Patient and/or caregiver will verbalize understanding.  -  Patient will state/observed weight bearing and anterior THR precautions with independence to reduce risk of injury.    EDUCATION/COMMUNICATION     Barriers to learning/limitations: None  Education provided CB:Jose Esparza on (+) role of OT, (+) OT plan of care, (+) Instructed patient in the benefits of maintaining activity tolerance, functional mobility, and independence with  self care tasks during acute stay  to ensure safe return home and to baseline. Encouraged patient to increase frequency and duration OOB, be out of bed for all meals, perform daily ADLs (as approved by RN/MD regarding bathing etc), and performing functional mobility to/from bathroom with staff assistance as needed., (+) weight bearing and anterior THR precautions, (+)  bathroom safety, home safety.  Reviewed: picture of safe bathroom & sitting area, adaptive equipment use, (+) staff assistance with mobility, (+) change positions frequently, (+) functional mobility, (+) discharge disposition/recommendations, (+) use of ice to assist with pain management  Educational handouts issued: OT and anterior THR, home safety booklet: bathroom safety, home safety, picture of safe bathroom & sitting area, reacher and sock aid use.   Patient / family response to education: verbalized and demonstrated understanding    SUBJECTIVE     Patient reports hip feels good    Pain Assessment: not rated, location: left  lower extremity    OBJECTIVE DATA SUMMARY     Patient was admitted to the hospital on 03/18/2022 with No chief complaint on file.    Present illness history:   Patient Active Problem List    Diagnosis Date Noted    Primary localized osteoarthritis of left hip 03/18/2022    Osteoarthritis of left hip, unspecified osteoarthritis type 03/18/2022      Previous medical history:   Past Medical History:   Diagnosis Date    History of esophageal spasm     Hyperlipidemia     Melanoma (Jose Esparza) 2015    neck    Primary osteoarthritis of left hip     Sarcoidosis     no nodules presently       PATIENT FOUND     Bed    COGNITIVE STATUS     Mental status:  Patient is awake, alert, pleasant and cooperative.  Patient is oriented to person, place, month and year.  Patient demonstrates appropriate eye contact, appropriate casual conversation and is attentive.  Communication: normal, speech and language intact  Attention Span:  normal  Follows  commands: intact; consistently follows commands  Safety/Judgement:appropriate awareness of environment and need for assistance    UPPER EXTREMITY ASSESSMENT     RIGHT:  ACTIVE range of motion is WNL Strength is grossly graded as  4/5   LEFT:   ACTIVE range of motion is WNL  Strength is grossly graded as 4/5       Comment(s):   RIGHT upper extremity: no c/o paresthesia, intact coordination  LEFT   upper extremity: no c/o paresthesia, intact coordination    ACTIVITIES OF DAILY LIVING     Based on direct observation, simulation and clinical assessment.  (clincial judgement based on: activity tolerance, balance, safety awareness, cognition, functional strength/ROM/coordination of all extremities)    Eating:           -  independent  Grooming:     - independent  UB bathing:   - independent, bath pack  LB bathing:   -  modified independent, bath pack  UB dressing: - independent, pull over shirt  LB dressing: - modified independent, pants , under wear  Toileting:       - modified independent    Comment(s):   -  Patient got dressed in street clothing for discharge home today.  -  Instructed patient  in the following:    1. Home safety - (I.E remove throw rugs, appropriate height for sitting, remove clutter/clear pathways,recliner safety, change of floor surfaces)   2. Bathroom safety - (I.E. Grab bars, DME options, no slip rugs)  3. AE options if needed.  to increase independence and fall prevention during standing level ADLs and mobility.   -   Patient instructed to don/doff left  LE first/last.   -   Patient instructed to don all clothing while seated then stand for over hips. Doff all clothing to knees in standing, then sit to doff clothing from knees to feet in order to facilitate fall prevention, pain management, and energy conservation during lower body ADLs.  Patient was instructed in adaptive equipment options for ADL's, reacher and sock aid, as needed.    FUNCTIONAL MOBILITY STATUS     Mobility:  Supine to sit -   modified independent  Sit to stand -  modified independent, rolling walker  Stand to sit -  modified independent, rolling walker     Transfers:  Functional transfers: modified independent, rolling walker    Comment(s):   -  Patient ambulated 50 feet with modified independent, rolling walker in prep for household distances and IADLs.  Patient required cueing for walker management  -  Instructed patient in proper positioning and safety during mobility and transfers:   Lead with the walker and then the surgical leg out in front of you.  When attempting to sit or stand, for comfort you can extend the surgical leg out in front of you.  When backing up to a chair, bring the non-surgical leg first.  -  Discussed with patient the following 1. Do not drive while taking a narcotic pain medicine  2. Do not drive until cleared by your surgeon     -  Instructed patient in proper hand placement technique during sit <->stand:  Push up from the hand rails or sitting surface of the chair/bedside commode/bed when standing.   Always reach back for the hand rails or sitting surface of the chair/bedside commode/bed when sitting.      -  Instructed patient in use of ice to assist with pain management.     BALANCE AND ACTIVITY TOLERANCE     Static Sitting Balance -           normal:    maintains balance against maximum resistance  Dynamic Sitting Balance -      normal:    independent with high level dynamic balance activities   Static Standing Balance -       good (-):  maintains balance against minimum resistance  Dynamic Standing Balance -  good (-):  independent with basic dynamic balance activities     Activity Tolerance:   - good tolerance to activity during OT session  - no apparent distress    Comment(s):   -  Instructed patient in the following; 1. Encouraged patient to sit up in chair 45-60 minutes at a time.  2.  Alternate between the bed, walking with staff and sitting in the chair to ease  surgical pain. 3. The importance of  activity while hospitalized to prevent a decline in function.  Recommend the patient get out of bed to chair 2-3 times a day, with assistance as needed.    FINAL LOCATION     Seated in bed side chair, all needs within reach. Patient agrees to call for assistance.  Patient set up with tray table. (+) ice to left  hip    Thank you for this referral.    Leighton Ruff, OTR/L  Mar 19, 2022    Barthel Index for Activities of Daily Living (ADL) (an objective, standardized tool for measuring functional status).     Assesses functional disability by quantifying patient performance in 10 activities of daily life (ADLs), (scored in increments of 5 points). Record actual, not potential, functioning. Information can be obtained from the patent's self-report, a separate party who is familiar with the patient's abilities (such as a relative/caregiver), or observation.    Total Barthel Index Score: 90 / 100    The Sinoff 1997 Interpretation:  No. of Points Status   80-100 Independent   60-79 Minimally dependent   40-59 Partially dependent   20-39 Very dependent   <20 Totally dependent     References  Mahoney FI, Barthel D. "Functional evaluation: the Barthel Index."  [place] 1965;14:56-61. Used with permission.    Sinoff G, [place] The Barthel activities of daily living index: self-reporting versus actual performance in the old-old (> or = 75 years). [place]; 45(7):832-6.

## 2022-03-19 NOTE — Discharge Summary (Signed)
Total HIP  Replacement  Discharge Summary      Patient ID:  Jose Esparza  [id number]  61 y.o.  [birth year]    Admit date: 03/18/2022    Discharge date and time: Mar 19, 2022    Admitting Physician: Caryl Ada, MD     Discharge Physician: Caryl Ada, MD    Admission Diagnoses: Osteoarthritis of left hip, unspecified osteoarthritis type [M16.12]  Primary localized osteoarthritis of left hip [M16.12]    Discharge Diagnoses: Principal Problem:    Primary localized osteoarthritis of left hip  Active Problems:    Osteoarthritis of left hip, unspecified osteoarthritis type  Resolved Problems:    * No resolved hospital problems. *      Surgeon: Caryl Ada, MD    IMPLANTS:   Implant Name Type Inv. Item Serial No. Manufacturer Lot No. LRB No. Used Action   SCREW HEX LOW PROFILE 6.5X25MM - [id number] Screw/Plate/Nail/Rod SCREW HEX LOW PROFILE 6.5X25MM  [employer] HOWM[employer]CR [id number] Left 1 Implanted   HEX DOME HOLE PLUG - [id number] Synthetics HEX DOME HOLE PLUG  [employer] HOWM[employer]CR [id number] Left 1 Implanted   SHELL ACETABULAR CLUSTERHOLE TRIDENT II SZ E - [id number] Joint Component SHELL ACETABULAR CLUSTERHOLE TRIDENT II SZ E  [employer] HOWM_CR [id number] Left 1 Implanted   SCREW HEX LOW PROFILE 6.5X25MM - [id number] Screw/Plate/Nail/Rod SCREW HEX LOW PROFILE 6.5X25MM  [employer] HOWM[employer]CR [id number] Left 1 Implanted   INSERT POLY TRIDENT SZ E - [id number] Joint Component INSERT POLY TRIDENT SZ E  [employer] HOWM_CR [id number] Left 1 Implanted   STEM FEMORAL ACCOLADE II SZ 7 127 DEG - [id number] Joint Component STEM FEMORAL ACCOLADE II SZ 7 127 DEG  [employer] HOWM[employer]CR [id number] Left 1 Implanted   HEAD FEMORAL BIOLOX DELTA +4MM - [id number] Joint Component HEAD FEMORAL BIOLOX DELTA +4MM  [employer] HOWM[employer]CR [id number] Left 1 Implanted       Perioperative Antibiotics: Ancef  2000mg  IV x 3 doses                                                DVT  Prophylaxis:  ASA 325 mg BID            TED Hose                                  SCD's    Postoperative transfusions:  PRBCs:  none     Post Op complications: none    LAB:    Lab Results   Component Value Date/Time    INR 0.9 02/18/2022 09:23 AM     Lab Results   Component Value Date/Time    HGB 12.8 03/19/2022 05:53 AM    HGB 14.3 02/18/2022 09:23 AM     Lab Results   Component Value Date/Time    HCT 37.3 03/19/2022 05:53 AM    HCT 42.1 02/18/2022 09:23 AM         Surgery day of admission  Anterior Approach Left THA, uncomplicated.    Started on ASA 325 mg BID POD# 0    Drains: None    Wound appears to be healing without any evidence of infection. No evidence of Deep venous thrombosis.    Physical Therapy started on the day  following surgery and progressed to independent ambulation with the aid of a walker.  At the time of discharge, able to go up and down stairs and had understanding of precautions needed following surgery.  The internal medicine hospitalist was consulted routinely following surgery.    PT at time of DC: Ambulate full weight bearing as tolerated with walker or crutches, Anterior Hip Instructions.    Discharged to: Home with home health PT.    Discharge instructions:  -Anticoagulate with: Aspirin 325 mg twice daily for 6 weeks.  -Resume pre hospital diet            -Resume home medications per medical continuation form     -Ambulate with walker, appropriate total joint protocol  -Follow up in office as scheduled       Signed:  Rhunette Croft, PA-C  Mar 19, 2022  7:52 AM

## 2022-03-19 NOTE — Progress Notes (Signed)
Orthopaedics Daily Progress Note    Patient without new complaints status post Procedure(s):  LEFT HIP ARTHROPLASTY; ANTERIOR 03/18/2022 .  No new changes. Pain controlled.     Patient Vitals for the past 24 hrs:   BP Temp Temp src Pulse Resp SpO2   03/19/22 0720 125/79 98.4 F (36.9 C) Oral 61 18 100 %   03/19/22 0517 109/68 97.2 F (36.2 C) Temporal 56 17 99 %   03/19/22 0025 104/67 97.7 F (36.5 C) -- 57 17 97 %   03/18/22 2006 117/75 98.1 F (36.7 C) Oral 64 17 95 %   03/18/22 1604 119/71 97.9 F (36.6 C) Temporal 68 18 94 %   03/18/22 1026 116/68 97.5 F (36.4 C) Oral 59 18 95 %   03/18/22 0950 -- -- -- 66 17 94 %   03/18/22 0942 129/71 -- -- 62 17 93 %   03/18/22 0929 -- -- -- 63 11 95 %   03/18/22 0928 124/71 -- -- -- -- --   03/18/22 0912 (!) 126/101 -- -- 62 15 100 %   03/18/22 0908 112/63 99 F (37.2 C) Temporal 59 12 100 %       WBC 13.8  Hgb 12.8  Plt 183  Cr 0.83    Intra -op fluor left hip : well aligned components with no complications. no fractures.    Physical exam:  Left Lower extremity: Dressing dry.  NVI Bilateral Lower Extremities.   2+ DP pulse, motor 5/5 TA/GSC, SILT SP/DP/Tib nerves. Calves soft and nontender.       Assessment:  Status post Procedure(s):  LEFT HIP ARTHROPLASTY; ANTERIOR 03/18/2022,  Progressing as expected.      Anemia: Mild postoperative, monitor for symptoms.    PLAN:  Mobilize with P.T./OT.  WBAT  ASA BID for DVT prophylaxis.  Discharge Planning for discharge to home with home health today.       Caryl Ada, MD  03/19/2022  7:25 AM

## 2022-03-27 NOTE — Progress Notes (Signed)
Phone conversation with patient after discharge for hip replacement surgery.  Patient reports pain is managed with medication/ice.   Patient reports maintaining surgical precautions and doing physical therapy exercises twice daily.  Patient reports taking medication as prescribed, pumping ankles.  Patient reports no sign/symptom of infection.  Patient denies questions or concerns at this time.  Patient reports follow up appointment June 9 in Dr. Lynelle Doctor office.

## 2023-05-15 ENCOUNTER — Encounter

## 2023-05-26 ENCOUNTER — Inpatient Hospital Stay: Admit: 2023-05-26 | Payer: PRIVATE HEALTH INSURANCE | Primary: Internal Medicine

## 2023-05-26 DIAGNOSIS — R10813 Right lower quadrant abdominal tenderness: Secondary | ICD-10-CM
